# Patient Record
Sex: Male | Born: 1945
Health system: Southern US, Community
[De-identification: ages and names within clinical notes are randomized; demographics above are authoritative.]

## PROBLEM LIST (undated history)

## (undated) DIAGNOSIS — I10 Essential (primary) hypertension: Secondary | ICD-10-CM

## (undated) DIAGNOSIS — E7800 Pure hypercholesterolemia, unspecified: Secondary | ICD-10-CM

## (undated) DIAGNOSIS — C61 Malignant neoplasm of prostate: Secondary | ICD-10-CM

## (undated) DIAGNOSIS — H9190 Unspecified hearing loss, unspecified ear: Secondary | ICD-10-CM

## (undated) HISTORY — PX: OTHER SURGICAL HISTORY: SHX169

---

## 2014-09-10 DIAGNOSIS — R972 Elevated prostate specific antigen [PSA]: Secondary | ICD-10-CM | POA: Insufficient documentation

## 2014-10-30 DIAGNOSIS — C61 Malignant neoplasm of prostate: Secondary | ICD-10-CM | POA: Insufficient documentation

## 2014-12-16 ENCOUNTER — Emergency Department (HOSPITAL_COMMUNITY)
Admission: EM | Admit: 2014-12-16 | Discharge: 2014-12-16 | Disposition: A | Discharge status: Home / Self Care | Payer: Medicare Other | Attending: Emergency Medicine | Admitting: Emergency Medicine

## 2014-12-16 ENCOUNTER — Encounter (HOSPITAL_COMMUNITY): Payer: Self-pay | Admitting: Emergency Medicine

## 2014-12-16 ENCOUNTER — Emergency Department (HOSPITAL_COMMUNITY): Payer: Medicare Other

## 2014-12-16 DIAGNOSIS — S60812A Abrasion of left wrist, initial encounter: Secondary | ICD-10-CM | POA: Diagnosis not present

## 2014-12-16 DIAGNOSIS — Y9289 Other specified places as the place of occurrence of the external cause: Secondary | ICD-10-CM | POA: Diagnosis not present

## 2014-12-16 DIAGNOSIS — W2209XA Striking against other stationary object, initial encounter: Secondary | ICD-10-CM | POA: Diagnosis not present

## 2014-12-16 DIAGNOSIS — C61 Malignant neoplasm of prostate: Secondary | ICD-10-CM | POA: Diagnosis not present

## 2014-12-16 DIAGNOSIS — T1490XA Injury, unspecified, initial encounter: Secondary | ICD-10-CM

## 2014-12-16 DIAGNOSIS — Y998 Other external cause status: Secondary | ICD-10-CM | POA: Diagnosis not present

## 2014-12-16 DIAGNOSIS — I10 Essential (primary) hypertension: Secondary | ICD-10-CM | POA: Diagnosis not present

## 2014-12-16 DIAGNOSIS — Z8639 Personal history of other endocrine, nutritional and metabolic disease: Secondary | ICD-10-CM | POA: Diagnosis not present

## 2014-12-16 DIAGNOSIS — Y9389 Activity, other specified: Secondary | ICD-10-CM | POA: Diagnosis not present

## 2014-12-16 DIAGNOSIS — R52 Pain, unspecified: Secondary | ICD-10-CM

## 2014-12-16 DIAGNOSIS — S6992XA Unspecified injury of left wrist, hand and finger(s), initial encounter: Secondary | ICD-10-CM

## 2014-12-16 HISTORY — DX: Pure hypercholesterolemia, unspecified: E78.00

## 2014-12-16 HISTORY — DX: Essential (primary) hypertension: I10

## 2014-12-16 HISTORY — DX: Malignant neoplasm of prostate: C61

## 2014-12-16 MED ORDER — HYDROCODONE-ACETAMINOPHEN 5-325 MG PO TABS: 1.0000 | ORAL_TABLET | ORAL | Status: DC | PRN

## 2014-12-16 NOTE — Discharge Instructions (Signed)
Take the prescribed medication as directed.  Wear ace wrap as placed for comfort.  May also wish to ice and elevate wrist to help with pain/swelling. Follow-up with your primary care physician. Return to the ED for new or worsening symptoms.
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
## 2015-05-11 DIAGNOSIS — N401 Enlarged prostate with lower urinary tract symptoms: Secondary | ICD-10-CM | POA: Insufficient documentation

## 2016-01-14 DIAGNOSIS — I10 Essential (primary) hypertension: Secondary | ICD-10-CM | POA: Insufficient documentation

## 2020-11-11 DIAGNOSIS — C61 Malignant neoplasm of prostate: Secondary | ICD-10-CM | POA: Diagnosis not present

## 2020-11-11 DIAGNOSIS — M129 Arthropathy, unspecified: Secondary | ICD-10-CM | POA: Diagnosis not present

## 2020-11-11 DIAGNOSIS — R310 Gross hematuria: Secondary | ICD-10-CM | POA: Insufficient documentation

## 2020-11-11 DIAGNOSIS — E559 Vitamin D deficiency, unspecified: Secondary | ICD-10-CM | POA: Diagnosis not present

## 2020-11-11 DIAGNOSIS — Z1159 Encounter for screening for other viral diseases: Secondary | ICD-10-CM | POA: Diagnosis not present

## 2020-11-11 DIAGNOSIS — Z23 Encounter for immunization: Secondary | ICD-10-CM | POA: Diagnosis not present

## 2020-11-11 DIAGNOSIS — R5383 Other fatigue: Secondary | ICD-10-CM | POA: Diagnosis not present

## 2020-11-11 DIAGNOSIS — Z131 Encounter for screening for diabetes mellitus: Secondary | ICD-10-CM | POA: Diagnosis not present

## 2020-11-11 DIAGNOSIS — E7800 Pure hypercholesterolemia, unspecified: Secondary | ICD-10-CM | POA: Diagnosis not present

## 2020-11-11 DIAGNOSIS — Z79899 Other long term (current) drug therapy: Secondary | ICD-10-CM | POA: Diagnosis not present

## 2020-11-11 DIAGNOSIS — Z0000 Encounter for general adult medical examination without abnormal findings: Secondary | ICD-10-CM | POA: Diagnosis not present

## 2020-12-08 DIAGNOSIS — R310 Gross hematuria: Secondary | ICD-10-CM | POA: Diagnosis not present

## 2020-12-08 DIAGNOSIS — R399 Unspecified symptoms and signs involving the genitourinary system: Secondary | ICD-10-CM | POA: Diagnosis not present

## 2020-12-08 DIAGNOSIS — R972 Elevated prostate specific antigen [PSA]: Secondary | ICD-10-CM | POA: Diagnosis not present

## 2020-12-08 DIAGNOSIS — K449 Diaphragmatic hernia without obstruction or gangrene: Secondary | ICD-10-CM | POA: Diagnosis not present

## 2020-12-08 DIAGNOSIS — C61 Malignant neoplasm of prostate: Secondary | ICD-10-CM | POA: Diagnosis not present

## 2020-12-09 DIAGNOSIS — Z79899 Other long term (current) drug therapy: Secondary | ICD-10-CM | POA: Diagnosis not present

## 2020-12-09 DIAGNOSIS — G8929 Other chronic pain: Secondary | ICD-10-CM | POA: Diagnosis not present

## 2020-12-09 DIAGNOSIS — Z1331 Encounter for screening for depression: Secondary | ICD-10-CM | POA: Diagnosis not present

## 2020-12-09 DIAGNOSIS — M5442 Lumbago with sciatica, left side: Secondary | ICD-10-CM | POA: Diagnosis not present

## 2020-12-09 DIAGNOSIS — Z1339 Encounter for screening examination for other mental health and behavioral disorders: Secondary | ICD-10-CM | POA: Diagnosis not present

## 2021-01-08 DIAGNOSIS — G8929 Other chronic pain: Secondary | ICD-10-CM | POA: Diagnosis not present

## 2021-01-08 DIAGNOSIS — I10 Essential (primary) hypertension: Secondary | ICD-10-CM | POA: Diagnosis not present

## 2021-01-08 DIAGNOSIS — R7303 Prediabetes: Secondary | ICD-10-CM | POA: Diagnosis not present

## 2021-01-08 DIAGNOSIS — M5442 Lumbago with sciatica, left side: Secondary | ICD-10-CM | POA: Diagnosis not present

## 2021-02-01 ENCOUNTER — Emergency Department (HOSPITAL_COMMUNITY)
Admission: EM | Admit: 2021-02-01 | Discharge: 2021-02-01 | Disposition: A | Discharge status: Home / Self Care | Payer: Medicare HMO | Attending: Emergency Medicine | Admitting: Emergency Medicine

## 2021-02-01 ENCOUNTER — Other Ambulatory Visit: Payer: Self-pay

## 2021-02-01 ENCOUNTER — Emergency Department (HOSPITAL_COMMUNITY): Payer: Medicare HMO

## 2021-02-01 ENCOUNTER — Encounter (HOSPITAL_COMMUNITY): Payer: Self-pay

## 2021-02-01 DIAGNOSIS — M25562 Pain in left knee: Secondary | ICD-10-CM | POA: Diagnosis not present

## 2021-02-01 DIAGNOSIS — Z8546 Personal history of malignant neoplasm of prostate: Secondary | ICD-10-CM | POA: Insufficient documentation

## 2021-02-01 DIAGNOSIS — W19XXXA Unspecified fall, initial encounter: Secondary | ICD-10-CM | POA: Diagnosis not present

## 2021-02-01 DIAGNOSIS — I10 Essential (primary) hypertension: Secondary | ICD-10-CM | POA: Diagnosis not present

## 2021-02-01 DIAGNOSIS — M1712 Unilateral primary osteoarthritis, left knee: Secondary | ICD-10-CM | POA: Diagnosis not present

## 2021-02-01 MED ORDER — LIDOCAINE 5 % EX PTCH: 1.0000 | MEDICATED_PATCH | CUTANEOUS | 0 refills | Status: DC

## 2021-02-01 MED ORDER — DICLOFENAC SODIUM 1 % EX GEL: 4.0000 g | Freq: Four times a day (QID) | CUTANEOUS | 0 refills | Status: DC

## 2021-02-01 NOTE — ED Provider Notes (Signed)
[facility] EMERGENCY DEPARTMENT Provider Note   CSN: [id number] Arrival date & time: 02/01/21  1543    History Chief Complaint  Patient presents with  . Leg Pain    Mason Aguilar is a 75 y.o. male with past medical history significant for hypertension, hypercholesterolemia, significantly hard of hearing who presents for evaluation of left knee pain.  Patient states 3 days ago he had a mechanical fall.  Patient states he has chronic bilateral knee pain, worse when he goes up stairs, going from sitting to standing.  Patient states he was walking and his left knee gave out on him.  He subsequently fell.  He denies hitting his head, LOC or anticoagulation.  Patient states his left knee has been hurting since he fell even worse than his baseline.  He denies any redness, swelling or warmth.  Pain does not extend into his distal leg, no hip pain, femur pain.  He was able to get up and walk.  He is not followed by orthopedics.  Does feel like he limps when he walks at baseline.  He takes oxycodone for chronic pain.  He denies any preceding headache, lightheadedness, dizziness, chest pain, shortness of breath, syncope, weakness, paresthesias.  Denies additional aggravating or alleviating factors.  History obtained from patient, family in room and past medical records.  Interpreter used.  HPI     Past Medical History:  Diagnosis Date  . Hypercholesteremia   . Hypertension   . Prostate cancer ([place])     There are no problems to display for this patient.   Past Surgical History:  Procedure Laterality Date  . arm surgery    . polyps         History reviewed. No pertinent family history.  Social History   Tobacco Use  . Smoking status: Never Smoker  . Smokeless tobacco: Never Used  Substance Use Topics  . Alcohol use: No  . Drug use: No    Home Medications Prior to Admission medications   Medication Sig Start Date End Date Taking? Authorizing Provider  diclofenac Sodium  (VOLTAREN) 1 % GEL Apply 4 g topically 4 (four) times daily. 02/01/21  Yes Gyselle Matthew A, PA-C  lidocaine (LIDODERM) 5 % Place 1 patch onto the skin daily. Remove & Discard patch within 12 hours or as directed by MD 02/01/21  Yes Anya Murphey A, PA-C  HYDROcodone-acetaminophen (NORCO/VICODIN) 5-325 MG per tablet Take 1 tablet by mouth every 4 (four) hours as needed. 12/16/14   Larene Pickett, PA-C    Allergies    Patient has no known allergies.  Review of Systems   Review of Systems  Constitutional: Negative.   HENT: Negative.   Respiratory: Negative.   Cardiovascular: Negative.   Gastrointestinal: Negative.   Genitourinary: Negative.   Musculoskeletal: Positive for gait problem (Limp).       Left knee pain  Skin: Negative.   All other systems reviewed and are negative.   Physical Exam Updated Vital Signs BP (!) 143/69 (BP Location: Right Arm)   Pulse 81   Temp 98.3 F (36.8 C) (Oral)   Resp 18   Ht 5\' 6"  (1.676 m)   Wt 106.6 kg   SpO2 96%   BMI 37.93 kg/m   Physical Exam Vitals and nursing note reviewed.  Constitutional:      General: He is not in acute distress.    Appearance: He is well-developed and well-nourished. He is not ill-appearing, toxic-appearing or diaphoretic.  HENT:  Head: Normocephalic and atraumatic.     Nose: Nose normal.     Mouth/Throat:     Mouth: Mucous membranes are moist.  Eyes:     Pupils: Pupils are equal, round, and reactive to light.  Cardiovascular:     Rate and Rhythm: Normal rate and regular rhythm.     Pulses: Normal pulses.     Heart sounds: Normal heart sounds.  Pulmonary:     Effort: Pulmonary effort is normal. No respiratory distress.     Breath sounds: Normal breath sounds.  Abdominal:     General: Bowel sounds are normal. There is no distension.     Palpations: Abdomen is soft.     Tenderness: There is no abdominal tenderness. There is no right CVA tenderness, left CVA tenderness, guarding or rebound.   Musculoskeletal:        General: Normal range of motion.     Cervical back: Normal range of motion and neck supple.     Right lower leg: No edema.     Left lower leg: No edema.     Comments: Pelvis stable, nontender palpation.  No bony tenderness to bilateral femurs, tib-fib.  Able to flex and extend at bilateral knees, ankles.  Wiggles toes without difficulty.  No shortening or rotation of legs.  Negative varus and valgus stress.  Negative anterior drawer.  Diffuse tenderness to left anterior knee however no obvious effusion.  No overlying skin changes, abrasions.  Skin:    General: Skin is warm and dry.     Capillary Refill: Capillary refill takes less than 2 seconds.     Comments: No edema, erythema or warmth.  No fluctuance or induration.  Neurological:     General: No focal deficit present.     Mental Status: He is alert and oriented to person, place, and time.     Comments: CN 2-12 grossly intact Intact sensation 5/5 strength to BL upper and lower extremities  Psychiatric:        Mood and Affect: Mood and affect normal.     ED Results / Procedures / Treatments   Labs (all labs ordered are listed, but only abnormal results are displayed) Labs Reviewed - No data to display  EKG None  Radiology DG Knee Left Port  Result Date: 02/01/2021 CLINICAL DATA:  Fall 2 days ago with left knee pain, initial encounter EXAM: PORTABLE LEFT KNEE - 1-2 VIEW COMPARISON:  None. FINDINGS: Mild patellofemoral degenerative changes are noted. No acute fracture or dislocation is seen. No joint effusion is noted. IMPRESSION: Mild degenerative change without acute abnormality. Electronically Signed   By: Inez Catalina M.D.   On: 02/01/2021 16:38    Procedures Procedures   Medications Ordered in ED Medications - No data to display  ED Course  I have reviewed the triage vital signs and the nursing notes.  Pertinent labs & imaging results that were available during my care of the patient were  reviewed by me and considered in my medical decision making (see chart for details).  75 year old presents for evaluation mechanical fall which occurred 3 days ago.  Has baseline chronic knee pain for which she takes oxycodone.  He is afebrile, nonseptic, non-ill-appearing.  Mechanical fall which he landed on the lateral aspect of his left knee.  His pain worsening when he goes from sitting to standing and up stairs.  He has a nonfocal neuro exam without deficits.  No preceding symptoms prior to fall.  no paresthesias or weakness.  His pelvis is stable, nontender palpation.  I have low suspicion for radicular pain.  No tenderness of bilateral femurs, tib-fib.  negative varus, valgus stress.  Negative anterior drawer. Pelvis stable non tender, no shortening or rotation of leg. Midline spine WO tenderness. His compartments are soft  Likely acute on chronic pain.  He has no overlying skin changes to suggest infectious process, specifically no large effusion, erythema, warmth.  He is able to flex and extend without difficulty.  Able to straight leg raise.  Discussed anti-inflammatories, knee brace, follow-up with orthopedics.  X-rays obtained here which I personally reviewed show no evidence of fracture, dislocation.  Low suspicion for occult fracture.  Also low suspicion for septic joint, gout, hemarthrosis, myositis, VTE, rhabdomyolysis.  Patient and family agreeable to follow-up outpatient.  The patient has been appropriately medically screened and/or stabilized in the ED. I have low suspicion for any other emergent medical condition which would require further screening, evaluation or treatment in the ED or require inpatient management.  Patient is hemodynamically stable and in no acute distress.  Patient able to ambulate in department prior to ED.  Evaluation does not show acute pathology that would require ongoing or additional emergent interventions while in the emergency department or further inpatient  treatment.  I have discussed the diagnosis with the patient and answered all questions.  Pain is been managed while in the emergency department and patient has no further complaints prior to discharge.  Patient is comfortable with plan discussed in room and is stable for discharge at this time.  I have discussed strict return precautions for returning to the emergency department.  Patient was encouraged to follow-up with PCP/specialist refer to at discharge.  Patient seen by attending, Dr. Kathrynn Humble who agrees with above treatment, plan and disposition.    MDM Rules/Calculators/A&P                           Final Clinical Impression(s) / ED Diagnoses Final diagnoses:  Fall  Acute pain of left knee    Rx / DC Orders ED Discharge Orders         Ordered    lidocaine (LIDODERM) 5 %  Every 24 hours        02/01/21 1902    diclofenac Sodium (VOLTAREN) 1 % GEL  4 times daily        02/01/21 1902           Caeson Filippi A, PA-C 02/01/21 Royann Shivers, MD 02/01/21 1931

## 2021-02-01 NOTE — Discharge Instructions (Addendum)
May Take Ibuprofen as needed for pain  I have written for Lidoderm patches and Voltaren gel. Use as prescribed  Follow up with orthopedics  Return for new or worsening symptoms

## 2021-02-01 NOTE — ED Triage Notes (Signed)
Pt to er, niece with pt helping to communicate with him, states that pt is very hard of hearing and reads lips.  States that pt fell on Saturday and hurt his leg.  Pt states that he staggered and fell. States that he hurt his L leg and now he is limping when he walks.

## 2021-02-06 DIAGNOSIS — I10 Essential (primary) hypertension: Secondary | ICD-10-CM | POA: Diagnosis not present

## 2021-02-06 DIAGNOSIS — R7303 Prediabetes: Secondary | ICD-10-CM | POA: Diagnosis not present

## 2021-02-06 DIAGNOSIS — G8929 Other chronic pain: Secondary | ICD-10-CM | POA: Diagnosis not present

## 2021-02-06 DIAGNOSIS — M5442 Lumbago with sciatica, left side: Secondary | ICD-10-CM | POA: Diagnosis not present

## 2021-03-05 DIAGNOSIS — G8929 Other chronic pain: Secondary | ICD-10-CM | POA: Diagnosis not present

## 2021-03-05 DIAGNOSIS — E7800 Pure hypercholesterolemia, unspecified: Secondary | ICD-10-CM | POA: Diagnosis not present

## 2021-03-05 DIAGNOSIS — M5442 Lumbago with sciatica, left side: Secondary | ICD-10-CM | POA: Diagnosis not present

## 2021-03-05 DIAGNOSIS — R7303 Prediabetes: Secondary | ICD-10-CM | POA: Diagnosis not present

## 2021-03-05 DIAGNOSIS — Z79899 Other long term (current) drug therapy: Secondary | ICD-10-CM | POA: Diagnosis not present

## 2021-03-05 DIAGNOSIS — I10 Essential (primary) hypertension: Secondary | ICD-10-CM | POA: Diagnosis not present

## 2021-04-02 DIAGNOSIS — G8929 Other chronic pain: Secondary | ICD-10-CM | POA: Diagnosis not present

## 2021-04-02 DIAGNOSIS — M79605 Pain in left leg: Secondary | ICD-10-CM | POA: Diagnosis not present

## 2021-04-02 DIAGNOSIS — M5442 Lumbago with sciatica, left side: Secondary | ICD-10-CM | POA: Diagnosis not present

## 2021-04-02 DIAGNOSIS — M79604 Pain in right leg: Secondary | ICD-10-CM | POA: Diagnosis not present

## 2021-04-02 DIAGNOSIS — I10 Essential (primary) hypertension: Secondary | ICD-10-CM | POA: Diagnosis not present

## 2021-04-02 DIAGNOSIS — R0989 Other specified symptoms and signs involving the circulatory and respiratory systems: Secondary | ICD-10-CM | POA: Diagnosis not present

## 2021-04-08 ENCOUNTER — Other Ambulatory Visit: Payer: Self-pay

## 2021-04-08 ENCOUNTER — Ambulatory Visit: Payer: Medicare HMO | Admitting: Urology

## 2021-04-08 ENCOUNTER — Encounter: Payer: Self-pay | Admitting: Urology

## 2021-04-08 VITALS — BP 123/70 | HR 92 | Ht 67.0 in | Wt 235.0 lb

## 2021-04-08 DIAGNOSIS — R310 Gross hematuria: Secondary | ICD-10-CM

## 2021-04-08 DIAGNOSIS — R319 Hematuria, unspecified: Secondary | ICD-10-CM | POA: Diagnosis not present

## 2021-04-08 DIAGNOSIS — C61 Malignant neoplasm of prostate: Secondary | ICD-10-CM

## 2021-04-08 LAB — BLADDER SCAN AMB NON-IMAGING: Scan Result: 0

## 2021-04-08 NOTE — Progress Notes (Signed)
04/08/21 1:31 PM   Mason Aguilar [birth year] [id number]  CC: Gross hematuria, history of prostate cancer, possible renal mass  HPI: I saw Mason Aguilar and his sister, who is his guardian, in clinic today for the above issues.  He is a 75 year old male with developmental delay who previously was seen by urology at [facility].  His history is notable for unfavorable intermediate risk prostate cancer diagnosed in October 2015 with pretreatment PSA of 5.8 and reported SV invasion on MRI with negative bone scan and prostate volume of 60 g.  He was treated with ADT and external beam radiation.  Unclear how long he was on ADT for.  PSA initially decreased to 0.01, but has slowly risen since that time, most recently 1.01 in November 2021.  Most recently, he developed painless gross hematuria in December 2021 and underwent a noncontrast CT abdomen and pelvis that showed no urolithiasis or hydronephrosis, and a reported soft tissue prominence along the lower pole of the left kidney that measured 3 cm.  Exam limited by lack of contrast.  I am unable to personally review these films.  He also underwent a cystoscopy which reportedly showed changes consistent with radiation cystitis at the bladder neck.  He denies any pain at this time.  He takes Flomax.  He had another episode a week ago of painless gross hematuria with clots.  IPSS score today is 5 with quality of life mostly satisfied.  PVR normal today at 0 mL.  Urinalysis today benign with 0-5 WBCs, 0-2 RBCs, no bacteria, no yeast, nitrite negative, no leukocytes.   They live in [place] and would prefer follow-up there.  PMH: Past Medical History:  Diagnosis Date  . Hypercholesteremia   . Hypertension   . Prostate cancer [facility])     Surgical History: Past Surgical History:  Procedure Laterality Date  . arm surgery    . polyps     Social History:  reports that he has never smoked. He has never used smokeless tobacco. He reports that  he does not drink alcohol and does not use drugs.  Physical Exam: BP 123/70   Pulse 92   Ht 5\' 7"  (1.702 m)   Wt 235 lb (106.6 kg)   BMI 36.81 kg/m    Constitutional:  Alert and oriented, No acute distress. Cardiovascular: No clubbing, cyanosis, or edema. Respiratory: Normal respiratory effort, no increased work of breathing. GI: Abdomen is soft, nontender, nondistended, no abdominal masses  Laboratory Data: Reviewed, see HPI  Pertinent Imaging: See HPI  Assessment & Plan:   75 year old male with history of unfavorable intermediate risk prostate cancer in 2015 treated with ADT of unclear duration and external beam radiation with initial significant decrease in PSA to 0, now with slow rise to 1.01, as well as multiple episodes of painless gross hematuria over the last few months.  He has undergone partial work-up in December 2021 by a urologist at [facility] with a non-contrast CT that showed a possible left renal mass and cystoscopy that was consistent with radiation cystitis.  I recommended a PSA today, as well as a CT urogram for better evaluation of the previously mentioned possible renal mass, as well as to complete his gross hematuria work-up.  We discussed possible need for cystoscopy and bladder biopsy in the future to confirm the diagnosis of radiation cystitis.  We also discussed treatment options including hyperbaric oxygen therapy if he is confirmed to have radiation cystitis.  PSA today, call with results  CT urogram ordered, call with results Will facilitate follow-up in [place] locally  Nickolas Madrid, MD 04/08/2021  [facility] [address], [place], [place] [address] [phone]

## 2021-04-09 LAB — PSA: Prostate Specific Ag, Serum: 1.6 ng/mL (ref 0.0–4.0)

## 2021-04-12 LAB — MICROSCOPIC EXAMINATION
Bacteria, UA: NONE SEEN
Epithelial Cells (non renal): NONE SEEN /hpf (ref 0–10)

## 2021-04-12 LAB — URINALYSIS, COMPLETE
Bilirubin, UA: NEGATIVE
Glucose, UA: NEGATIVE
Ketones, UA: NEGATIVE
Leukocytes,UA: NEGATIVE
Nitrite, UA: NEGATIVE
Protein,UA: NEGATIVE
RBC, UA: NEGATIVE
Specific Gravity, UA: 1.02 (ref 1.005–1.030)
Urobilinogen, Ur: 2 mg/dL — ABNORMAL HIGH (ref 0.2–1.0)
pH, UA: 7 (ref 5.0–7.5)

## 2021-05-06 ENCOUNTER — Telehealth: Payer: Self-pay

## 2021-05-06 NOTE — Telephone Encounter (Signed)
-----   Message from Billey Co, MD sent at 05/06/2021  9:30 AM EDT ----- PSA has increased slightly to 1.6. Please confirm he has follow up locally in [place], thanks.   Nickolas Madrid, MD 05/06/2021

## 2021-05-06 NOTE — Telephone Encounter (Signed)
Called pt, no answer. LM for pt informing him of the information below. Advised pt to call back for questions or concerns.

## 2021-05-06 NOTE — Telephone Encounter (Signed)
Notified patient as instructed,  Still waiting on [place] to call

## 2021-05-07 NOTE — Telephone Encounter (Signed)
Patient has been scheduled a follow up appointment with Dr. Junious Silk in [place] for 05/31/2021 at 3:00pm.  Patient has been notified.

## 2021-05-09 ENCOUNTER — Emergency Department (HOSPITAL_COMMUNITY): Payer: Medicare HMO

## 2021-05-09 ENCOUNTER — Encounter (HOSPITAL_COMMUNITY): Payer: Self-pay | Admitting: Emergency Medicine

## 2021-05-09 ENCOUNTER — Other Ambulatory Visit: Payer: Self-pay

## 2021-05-09 ENCOUNTER — Emergency Department (HOSPITAL_COMMUNITY)
Admission: EM | Admit: 2021-05-09 | Discharge: 2021-05-10 | Disposition: A | Discharge status: Home / Self Care | Payer: Medicare HMO | Attending: Emergency Medicine | Admitting: Emergency Medicine

## 2021-05-09 DIAGNOSIS — Z79899 Other long term (current) drug therapy: Secondary | ICD-10-CM | POA: Insufficient documentation

## 2021-05-09 DIAGNOSIS — S1081XA Abrasion of other specified part of neck, initial encounter: Secondary | ICD-10-CM | POA: Diagnosis not present

## 2021-05-09 DIAGNOSIS — Y92410 Unspecified street and highway as the place of occurrence of the external cause: Secondary | ICD-10-CM | POA: Diagnosis not present

## 2021-05-09 DIAGNOSIS — I10 Essential (primary) hypertension: Secondary | ICD-10-CM | POA: Diagnosis not present

## 2021-05-09 DIAGNOSIS — S301XXA Contusion of abdominal wall, initial encounter: Secondary | ICD-10-CM | POA: Diagnosis not present

## 2021-05-09 DIAGNOSIS — Z8546 Personal history of malignant neoplasm of prostate: Secondary | ICD-10-CM | POA: Diagnosis not present

## 2021-05-09 DIAGNOSIS — S20212A Contusion of left front wall of thorax, initial encounter: Secondary | ICD-10-CM | POA: Diagnosis not present

## 2021-05-09 DIAGNOSIS — N289 Disorder of kidney and ureter, unspecified: Secondary | ICD-10-CM

## 2021-05-09 DIAGNOSIS — S6991XA Unspecified injury of right wrist, hand and finger(s), initial encounter: Secondary | ICD-10-CM | POA: Diagnosis present

## 2021-05-09 DIAGNOSIS — S62306A Unspecified fracture of fifth metacarpal bone, right hand, initial encounter for closed fracture: Secondary | ICD-10-CM

## 2021-05-09 DIAGNOSIS — Z7982 Long term (current) use of aspirin: Secondary | ICD-10-CM | POA: Diagnosis not present

## 2021-05-09 DIAGNOSIS — S62398A Other fracture of other metacarpal bone, initial encounter for closed fracture: Secondary | ICD-10-CM | POA: Diagnosis not present

## 2021-05-09 DIAGNOSIS — R918 Other nonspecific abnormal finding of lung field: Secondary | ICD-10-CM

## 2021-05-09 LAB — CBC
HCT: 45.3 % (ref 39.0–52.0)
Hemoglobin: 14.4 g/dL (ref 13.0–17.0)
MCH: 27.3 pg (ref 26.0–34.0)
MCHC: 31.8 g/dL (ref 30.0–36.0)
MCV: 86 fL (ref 80.0–100.0)
Platelets: 204 10*3/uL (ref 150–400)
RBC: 5.27 MIL/uL (ref 4.22–5.81)
RDW: 13.5 % (ref 11.5–15.5)
WBC: 14.9 10*3/uL — ABNORMAL HIGH (ref 4.0–10.5)
nRBC: 0 % (ref 0.0–0.2)

## 2021-05-09 LAB — BASIC METABOLIC PANEL
Anion gap: 7 (ref 5–15)
BUN: 14 mg/dL (ref 8–23)
CO2: 23 mmol/L (ref 22–32)
Calcium: 8.5 mg/dL — ABNORMAL LOW (ref 8.9–10.3)
Chloride: 106 mmol/L (ref 98–111)
Creatinine, Ser: 0.96 mg/dL (ref 0.61–1.24)
GFR, Estimated: >60 mL/min (ref >60)
Glucose, Bld: 98 mg/dL (ref 70–99)
Potassium: 3.6 mmol/L (ref 3.5–5.1)
Sodium: 136 mmol/L (ref 135–145)
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
## 2021-05-13 ENCOUNTER — Telehealth: Payer: Self-pay | Admitting: Orthopedic Surgery

## 2021-05-13 NOTE — Telephone Encounter (Signed)
May 26
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
## 2021-05-20 ENCOUNTER — Ambulatory Visit: Payer: Medicare HMO | Admitting: Orthopedic Surgery

## 2021-05-27 ENCOUNTER — Ambulatory Visit: Payer: Medicare HMO | Admitting: Orthopedic Surgery

## 2021-05-27 ENCOUNTER — Other Ambulatory Visit: Payer: Self-pay

## 2021-05-27 ENCOUNTER — Encounter: Payer: Self-pay | Admitting: Orthopedic Surgery

## 2021-05-27 ENCOUNTER — Ambulatory Visit: Payer: Medicare HMO

## 2021-05-27 VITALS — BP 139/79 | HR 83 | Ht 67.0 in | Wt 235.0 lb

## 2021-05-27 DIAGNOSIS — S62336A Displaced fracture of neck of fifth metacarpal bone, right hand, initial encounter for closed fracture: Secondary | ICD-10-CM | POA: Diagnosis not present

## 2021-05-27 NOTE — Addendum Note (Signed)
Addended byCandice Camp on: 05/27/2021 04:13 PM   Modules accepted: Orders
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
## 2021-05-27 NOTE — Patient Instructions (Signed)
[facility] will call you with appointment. If you have not heard from them in 2-3 business days call them to schedule [phone]   

## 2021-05-31 ENCOUNTER — Ambulatory Visit (INDEPENDENT_AMBULATORY_CARE_PROVIDER_SITE_OTHER): Payer: Medicare HMO | Admitting: Urology

## 2021-05-31 ENCOUNTER — Encounter: Payer: Self-pay | Admitting: Urology

## 2021-05-31 ENCOUNTER — Other Ambulatory Visit: Payer: Self-pay

## 2021-05-31 VITALS — BP 140/92 | HR 84 | Ht 67.0 in | Wt 232.6 lb

## 2021-05-31 DIAGNOSIS — R9721 Rising PSA following treatment for malignant neoplasm of prostate: Secondary | ICD-10-CM

## 2021-05-31 DIAGNOSIS — C61 Malignant neoplasm of prostate: Secondary | ICD-10-CM

## 2021-05-31 DIAGNOSIS — N281 Cyst of kidney, acquired: Secondary | ICD-10-CM

## 2021-05-31 DIAGNOSIS — R319 Hematuria, unspecified: Secondary | ICD-10-CM

## 2021-05-31 DIAGNOSIS — N2889 Other specified disorders of kidney and ureter: Secondary | ICD-10-CM

## 2021-05-31 LAB — URINALYSIS, ROUTINE W REFLEX MICROSCOPIC
Bilirubin, UA: NEGATIVE
Glucose, UA: NEGATIVE
Ketones, UA: NEGATIVE
Leukocytes,UA: NEGATIVE
Nitrite, UA: NEGATIVE
Protein,UA: NEGATIVE
RBC, UA: NEGATIVE
Specific Gravity, UA: 1.02 (ref 1.005–1.030)
Urobilinogen, Ur: 0.2 mg/dL (ref 0.2–1.0)
pH, UA: 7 (ref 5.0–7.5)
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
## 2021-05-31 NOTE — Progress Notes (Signed)
Urological Symptom Review  Patient is experiencing the following symptoms: Blood in urine   Review of Systems  Gastrointestinal (upper)  : Negative for upper GI symptoms  Gastrointestinal (lower) : Negative for lower GI symptoms  Constitutional : Negative for symptoms  Skin: Negative for skin symptoms  Eyes: Negative for eye symptoms  Ear/Nose/Throat : Negative for Ear/Nose/Throat symptoms  Hematologic/Lymphatic: Easy bruising  Cardiovascular : None  Respiratory : Negative for respiratory symptoms  Endocrine: Negative for endocrine symptoms  Musculoskeletal: Joint pain  Neurological: Dizziness  Psychologic: Negative for psychiatric symptoms

## 2021-06-02 ENCOUNTER — Encounter (HOSPITAL_BASED_OUTPATIENT_CLINIC_OR_DEPARTMENT_OTHER): Payer: Self-pay | Admitting: Orthopedic Surgery

## 2021-06-02 ENCOUNTER — Other Ambulatory Visit: Payer: Self-pay | Admitting: Orthopedic Surgery

## 2021-06-02 ENCOUNTER — Other Ambulatory Visit: Payer: Self-pay

## 2021-06-02 DIAGNOSIS — S62336A Displaced fracture of neck of fifth metacarpal bone, right hand, initial encounter for closed fracture: Secondary | ICD-10-CM | POA: Insufficient documentation

## 2021-06-03 ENCOUNTER — Ambulatory Visit (HOSPITAL_BASED_OUTPATIENT_CLINIC_OR_DEPARTMENT_OTHER)
Admission: RE | Admit: 2021-06-03 | Discharge: 2021-06-03 | Disposition: A | Discharge status: Home / Self Care | Payer: Medicare HMO | Attending: Orthopedic Surgery | Admitting: Orthopedic Surgery

## 2021-06-03 ENCOUNTER — Ambulatory Visit (HOSPITAL_BASED_OUTPATIENT_CLINIC_OR_DEPARTMENT_OTHER): Payer: Medicare HMO | Admitting: Anesthesiology

## 2021-06-03 ENCOUNTER — Encounter (HOSPITAL_BASED_OUTPATIENT_CLINIC_OR_DEPARTMENT_OTHER)
Admission: RE | Disposition: A | Discharge status: Home / Self Care | Payer: Self-pay | Source: Home / Self Care | Attending: Orthopedic Surgery

## 2021-06-03 ENCOUNTER — Other Ambulatory Visit: Payer: Self-pay

## 2021-06-03 ENCOUNTER — Encounter (HOSPITAL_BASED_OUTPATIENT_CLINIC_OR_DEPARTMENT_OTHER): Payer: Self-pay | Admitting: Orthopedic Surgery

## 2021-06-03 DIAGNOSIS — S62306A Unspecified fracture of fifth metacarpal bone, right hand, initial encounter for closed fracture: Secondary | ICD-10-CM | POA: Insufficient documentation

## 2021-06-03 DIAGNOSIS — Z8546 Personal history of malignant neoplasm of prostate: Secondary | ICD-10-CM | POA: Insufficient documentation

## 2021-06-03 DIAGNOSIS — E119 Type 2 diabetes mellitus without complications: Secondary | ICD-10-CM | POA: Diagnosis not present

## 2021-06-03 HISTORY — PX: OPEN REDUCTION INTERNAL FIXATION (ORIF) METACARPAL: SHX6234

## 2021-06-03 HISTORY — DX: Unspecified hearing loss, unspecified ear: H91.90

## 2021-06-03 SURGERY — OPEN REDUCTION INTERNAL FIXATION (ORIF) METACARPAL
Anesthesia: Monitor Anesthesia Care | Site: Hand | Laterality: Right

## 2021-06-03 MED ORDER — CLONIDINE HCL (ANALGESIA) 100 MCG/ML EP SOLN
EPIDURAL | Status: DC | PRN
  Administered 2021-06-03: 100 ug

## 2021-06-03 MED ORDER — OXYCODONE-ACETAMINOPHEN 5-325 MG PO TABS: 1.0000 | ORAL_TABLET | ORAL | 0 refills | Status: AC | PRN

## 2021-06-03 MED ORDER — BUPIVACAINE-EPINEPHRINE (PF) 0.5% -1:200000 IJ SOLN
INTRAMUSCULAR | Status: DC | PRN
  Administered 2021-06-03: 30 mL via PERINEURAL

## 2021-06-03 MED ORDER — PROPOFOL 500 MG/50ML IV EMUL
INTRAVENOUS | Status: DC | PRN
  Administered 2021-06-03: 75 ug/kg/min via INTRAVENOUS

## 2021-06-03 MED ORDER — LACTATED RINGERS IV SOLN: INTRAVENOUS | Status: DC

## 2021-06-03 MED ORDER — PROMETHAZINE HCL 25 MG/ML IJ SOLN: 6.2500 mg | INTRAMUSCULAR | Status: DC | PRN

## 2021-06-03 MED ORDER — PROPOFOL 10 MG/ML IV BOLUS
INTRAVENOUS | Status: DC | PRN
  Administered 2021-06-03 (×2): 20 mg via INTRAVENOUS

## 2021-06-03 MED ORDER — PROPOFOL 10 MG/ML IV BOLUS
INTRAVENOUS | Status: AC
  Filled 2021-06-03: qty 60

## 2021-06-03 MED ORDER — FENTANYL CITRATE (PF) 100 MCG/2ML IJ SOLN
50.0000 ug | Freq: Once | INTRAMUSCULAR | Status: AC
Start: 2021-06-03 — End: 2021-06-03
  Administered 2021-06-03: 50 ug via INTRAVENOUS

## 2021-06-03 MED ORDER — FENTANYL CITRATE (PF) 100 MCG/2ML IJ SOLN: 25.0000 ug | INTRAMUSCULAR | Status: DC | PRN

## 2021-06-03 MED ORDER — ONDANSETRON HCL 4 MG/2ML IJ SOLN
INTRAMUSCULAR | Status: AC
  Filled 2021-06-03: qty 2

## 2021-06-03 MED ORDER — OXYCODONE HCL 5 MG/5ML PO SOLN: 5.0000 mg | Freq: Once | ORAL | Status: DC | PRN

## 2021-06-03 MED ORDER — FENTANYL CITRATE (PF) 100 MCG/2ML IJ SOLN
INTRAMUSCULAR | Status: AC
  Filled 2021-06-03: qty 2

## 2021-06-03 MED ORDER — MIDAZOLAM HCL 2 MG/2ML IJ SOLN
INTRAMUSCULAR | Status: AC
  Filled 2021-06-03: qty 2

## 2021-06-03 MED ORDER — CEFAZOLIN SODIUM-DEXTROSE 2-4 GM/100ML-% IV SOLN
INTRAVENOUS | Status: AC
  Filled 2021-06-03: qty 100

## 2021-06-03 MED ORDER — BUPIVACAINE HCL (PF) 0.25 % IJ SOLN
INTRAMUSCULAR | Status: AC
  Filled 2021-06-03: qty 30

## 2021-06-03 MED ORDER — ONDANSETRON HCL 4 MG/2ML IJ SOLN
INTRAMUSCULAR | Status: DC | PRN
  Administered 2021-06-03: 4 mg via INTRAVENOUS

## 2021-06-03 MED ORDER — CEFAZOLIN SODIUM-DEXTROSE 2-4 GM/100ML-% IV SOLN
2.0000 g | INTRAVENOUS | Status: AC
  Administered 2021-06-03: 2 g via INTRAVENOUS

## 2021-06-03 MED ORDER — ACETAMINOPHEN 500 MG PO TABS: 1000.0000 mg | ORAL_TABLET | Freq: Once | ORAL | Status: DC

## 2021-06-03 MED ORDER — OXYCODONE HCL 5 MG PO TABS: 5.0000 mg | ORAL_TABLET | Freq: Once | ORAL | Status: DC | PRN

## 2021-06-03 SURGICAL SUPPLY — 50 items
APL PRP STRL LF DISP 70% ISPRP (MISCELLANEOUS) ×1
BIT DRILL 1.1 MINI (BIT) IMPLANT
BLADE MINI RND TIP GREEN BEAV (BLADE) IMPLANT
BLADE SURG 15 STRL LF DISP TIS (BLADE) ×1 IMPLANT
BLADE SURG 15 STRL SS (BLADE) ×3
BNDG CMPR 9X4 STRL LF SNTH (GAUZE/BANDAGES/DRESSINGS) ×1
BNDG COHESIVE 3X5 TAN STRL LF (GAUZE/BANDAGES/DRESSINGS) ×3 IMPLANT
BNDG ESMARK 4X9 LF (GAUZE/BANDAGES/DRESSINGS) ×3 IMPLANT
BNDG GAUZE ELAST 4 BULKY (GAUZE/BANDAGES/DRESSINGS) ×2 IMPLANT
CHLORAPREP W/TINT 26 (MISCELLANEOUS) ×3 IMPLANT
CORD BIPOLAR FORCEPS 12FT (ELECTRODE) ×3 IMPLANT
COVER BACK TABLE 60X90IN (DRAPES) ×3 IMPLANT
COVER MAYO STAND STRL (DRAPES) ×3 IMPLANT
COVER WAND RF STERILE (DRAPES) IMPLANT
CUFF TOURN SGL QUICK 18X4 (TOURNIQUET CUFF) ×2 IMPLANT
DECANTER SPIKE VIAL GLASS SM (MISCELLANEOUS) IMPLANT
DRAPE EXTREMITY T 121X128X90 (DISPOSABLE) ×3 IMPLANT
DRAPE OEC MINIVIEW 54X84 (DRAPES) ×3 IMPLANT
DRAPE SURG 17X23 STRL (DRAPES) ×3 IMPLANT
DRILL BIT 1.1 MINI (BIT) ×3
GAUZE SPONGE 4X4 12PLY STRL (GAUZE/BANDAGES/DRESSINGS) ×3 IMPLANT
GAUZE XEROFORM 1X8 LF (GAUZE/BANDAGES/DRESSINGS) ×3 IMPLANT
GLOVE SURG ORTHO LTX SZ8 (GLOVE) ×3 IMPLANT
GLOVE SURG UNDER POLY LF SZ8.5 (GLOVE) ×3 IMPLANT
GOWN STRL REUS W/ TWL LRG LVL3 (GOWN DISPOSABLE) IMPLANT
GOWN STRL REUS W/TWL LRG LVL3 (GOWN DISPOSABLE) ×3
GOWN STRL REUS W/TWL XL LVL3 (GOWN DISPOSABLE) ×5 IMPLANT
NDL PRECISIONGLIDE 27X1.5 (NEEDLE) IMPLANT
NEEDLE PRECISIONGLIDE 27X1.5 (NEEDLE) IMPLANT
NS IRRIG 1000ML POUR BTL (IV SOLUTION) ×3 IMPLANT
PACK BASIN DAY SURGERY FS (CUSTOM PROCEDURE TRAY) ×3 IMPLANT
PAD CAST 3X4 CTTN HI CHSV (CAST SUPPLIES) ×1 IMPLANT
PADDING CAST ABS 4INX4YD NS (CAST SUPPLIES)
PADDING CAST ABS COTTON 4X4 ST (CAST SUPPLIES) ×1 IMPLANT
PADDING CAST COTTON 3X4 STRL (CAST SUPPLIES) ×3
SCREW CORTEX 1.5X11 (Screw) ×2 IMPLANT
SCREW CORTEX 1.5X12 (Screw) ×4 IMPLANT
SLEEVE SCD COMPRESS KNEE MED (STOCKING) IMPLANT
SLING ARM FOAM STRAP LRG (SOFTGOODS) ×2 IMPLANT
SPLINT PLASTER CAST XFAST 3X15 (CAST SUPPLIES) IMPLANT
SPLINT PLASTER XTRA FASTSET 3X (CAST SUPPLIES) ×40
STOCKINETTE 4X48 STRL (DRAPES) ×3 IMPLANT
SUT CHROMIC 5 0 P 3 (SUTURE) IMPLANT
SUT ETHILON 4 0 PS 2 18 (SUTURE) ×3 IMPLANT
SUT MERSILENE 4 0 P 3 (SUTURE) ×2 IMPLANT
SUT VICRYL 4-0 PS2 18IN ABS (SUTURE) ×2 IMPLANT
SYR BULB EAR ULCER 3OZ GRN STR (SYRINGE) ×3 IMPLANT
SYR CONTROL 10ML LL (SYRINGE) IMPLANT
TOWEL GREEN STERILE FF (TOWEL DISPOSABLE) ×3 IMPLANT
UNDERPAD 30X36 HEAVY ABSORB (UNDERPADS AND DIAPERS) ×3 IMPLANT

## 2021-06-03 NOTE — Anesthesia Procedure Notes (Signed)
Anesthesia Regional Block: Supraclavicular block   Pre-Anesthetic Checklist: , timeout performed,  Correct Patient, Correct Site, Correct Laterality,  Correct Procedure, Correct Position, site marked,  Risks and benefits discussed,  Pre-op evaluation,  At surgeon's request and post-op pain management  Laterality: Right  Prep: Maximum Sterile Barrier Precautions used, chloraprep       Needles:  Injection technique: Single-shot  Needle Type: Echogenic Stimulator Needle     Needle Length: 9cm  Needle Gauge: 22     Additional Needles:   Procedures:,,,, ultrasound used (permanent image in chart),,    Narrative:  Start time: 06/03/2021 2:03 PM End time: 06/03/2021 2:06 PM Injection made incrementally with aspirations every 5 mL.  Performed by: Personally  Anesthesiologist: Brennan Bailey, MD  Additional Notes: Risks, benefits, and alternative discussed. Patient gave consent for procedure. Patient prepped and draped in sterile fashion. Sedation administered, patient remains easily responsive to voice. Relevant anatomy identified with ultrasound guidance. Local anesthetic given in 5cc increments with no signs or symptoms of intravascular injection. No pain or paraesthesias with injection. Patient monitored throughout procedure with signs of LAST or immediate complications. Tolerated well. Ultrasound image placed in chart.  Tawny Asal, MD

## 2021-06-03 NOTE — Discharge Instructions (Addendum)
  Post Anesthesia Home Care Instructions  Activity: Get plenty of rest for the remainder of the day. A responsible individual must stay with you for 24 hours following the procedure.  For the next 24 hours, DO NOT: -Drive a car -Paediatric nurse -Drink alcoholic beverages -Take any medication unless instructed by your physician -Make any legal decisions or sign important papers.  Meals: Start with liquid foods such as gelatin or soup. Progress to regular foods as tolerated. Avoid greasy, spicy, heavy foods. If nausea and/or vomiting occur, drink only clear liquids until the nausea and/or vomiting subsides. Call your physician if vomiting continues.  Special Instructions/Symptoms: Your throat may feel dry or sore from the anesthesia or the breathing tube placed in your throat during surgery. If this causes discomfort, gargle with warm salt water. The discomfort should disappear within 24 hours.  Regional Anesthesia Blocks  1. Numbness or the inability to move the "blocked" extremity may last from 3-48 hours after placement. The length of time depends on the medication injected and your individual response to the medication. If the numbness is not going away after 48 hours, call your surgeon.  2. The extremity that is blocked will need to be protected until the numbness is gone and the  Strength has returned. Because you cannot feel it, you will need to take extra care to avoid injury. Because it may be weak, you may have difficulty moving it or using it. You may not know what position it is in without looking at it while the block is in effect.  3. For blocks in the legs and feet, returning to weight bearing and walking needs to be done carefully. You will need to wait until the numbness is entirely gone and the strength has returned. You should be able to move your leg and foot normally before you try and bear weight or walk. You will need someone to be with you when you first try to ensure  you do not fall and possibly risk injury.  4. Bruising and tenderness at the needle site are common side effects and will resolve in a few days.  5. Persistent numbness or new problems with movement should be communicated to the surgeon or the [place] [phone] [place] [phone]). [facility] Instructions Hand Surgery  Wound Care: Keep your hand elevated above the level of your heart.  Do not allow it to dangle by your side.  Keep the dressing dry and do not remove it unless your doctor advises you to do so.  He will usually change it at the time of your post-op visit.  Moving your fingers is advised to stimulate circulation but will depend on the site of your surgery.  If you have a splint applied, your doctor will advise you regarding movement.  Activity: Do not drive or operate machinery today.  Rest today and then you may return to your normal activity and work as indicated by your physician.  Diet:  Drink liquids today or eat a light diet.  You may resume a regular diet tomorrow.    General expectations: Pain for two to three days. Fingers may become slightly swollen.  Call your doctor if any of the following occur: Severe pain not relieved by pain medication. Elevated temperature. Dressing soaked with blood. Inability to move fingers. White or bluish color to fingers.

## 2021-06-03 NOTE — Anesthesia Preprocedure Evaluation (Addendum)
Anesthesia Evaluation  Patient identified by MRN, date of birth, ID band Patient awake    Reviewed: Allergy & Precautions, NPO status , Patient's Chart, lab work & pertinent test results  History of Anesthesia Complications Negative for: history of anesthetic complications  Airway Mallampati: II  TM Distance: >3 FB Neck ROM: Full    Dental no notable dental hx.    Pulmonary neg pulmonary ROS,    Pulmonary exam normal        Cardiovascular hypertension, Pt. on medications Normal cardiovascular exam     Neuro/Psych negative neurological ROS  negative psych ROS   GI/Hepatic Neg liver ROS, GERD  Medicated and Controlled,  Endo/Other  negative endocrine ROS  Renal/GU negative Renal ROS  negative genitourinary   Musculoskeletal negative musculoskeletal ROS (+)   Abdominal   Peds  Hematology negative hematology ROS (+)   Anesthesia Other Findings Day of surgery medications reviewed with patient.  Reproductive/Obstetrics negative OB ROS                            Anesthesia Physical Anesthesia Plan  ASA: 2  Anesthesia Plan: MAC and Regional   Post-op Pain Management:    Induction:   PONV Risk Score and Plan: 1 and Treatment may vary due to age or medical condition, Propofol infusion and Ondansetron  Airway Management Planned: Natural Airway and Simple Face Mask  Additional Equipment: None  Intra-op Plan:   Post-operative Plan:   Informed Consent: I have reviewed the patients History and Physical, chart, labs and discussed the procedure including the risks, benefits and alternatives for the proposed anesthesia with the patient or authorized representative who has indicated his/her understanding and acceptance.     Dental advisory given  Plan Discussed with: CRNA  Anesthesia Plan Comments:        Anesthesia Quick Evaluation

## 2021-06-03 NOTE — Transfer of Care (Signed)
Immediate Anesthesia Transfer of Care Note  Patient: Mason Aguilar  Procedure(s) Performed: OPEN REDUCTION INTERNAL FIXATION (ORIF)RIGHT FIFTH  METACARPAL (Right: Hand)  Patient Location: PACU  Anesthesia Type:MAC combined with regional for post-op pain  Level of Consciousness: sedated  Airway & Oxygen Therapy: Patient Spontanous Breathing  Post-op Assessment: Report given to RN and Post -op Vital signs reviewed and stable  Post vital signs: Reviewed and stable  Last Vitals:  Vitals Value Taken Time  BP 114/62 06/03/21 1617  Temp    Pulse 75 06/03/21 1618  Resp 20 06/03/21 1618  SpO2 91 % 06/03/21 1618  Vitals shown include unvalidated device data.  Last Pain:  Vitals:   06/03/21 1415  TempSrc:   PainSc: Asleep      Patients Stated Pain Goal: 5 (93/[id number]/42 [id number])  Complications: No notable events documented.

## 2021-06-03 NOTE — Progress Notes (Signed)
Pt is not a good historian. Not familiar with med, health history, etc. Called niece "Roanna Epley" who verified meds taken and NPO status (NPO since MN except for med sip water). Roanna Epley says she will transport pt home and provide 24 hr care. Roanna Epley is with her mother (also having a surgical procedure) and will return to [facility] at 1530-1600 to take pt home.
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
## 2021-06-03 NOTE — Progress Notes (Signed)
Assisted Dr. Howze with left, ultrasound guided, supraclavicular block. Side rails up, monitors on throughout procedure. See vital signs in flow sheet. Tolerated Procedure well. 
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
## 2021-06-03 NOTE — Op Note (Signed)
I assisted Surgeon(s) and Role:    * Daryll Brod, MD - Primary    Leanora Cover, MD - Assisting on the Procedure(s): OPEN REDUCTION INTERNAL FIXATION (ORIF)RIGHT FIFTH  METACARPAL on 06/03/2021.  I provided assistance on this case as follows: clearing and reduction of fracture, placement hardware.  Electronically signed by: Leanora Cover, MD Date: 06/03/2021 Time: 4:13 PM

## 2021-06-03 NOTE — Brief Op Note (Signed)
06/03/2021  4:13 PM  PATIENT:  Mason Aguilar  75 y.o. male  PRE-OPERATIVE DIAGNOSIS:  FRACTURE OF RIGHT FIFTH METACARPAL  POST-OPERATIVE DIAGNOSIS:  FRACTURE OF RIGHT FIFTH METACARPAL  PROCEDURE:  Procedure(s) with comments: OPEN REDUCTION INTERNAL FIXATION (ORIF)RIGHT FIFTH  METACARPAL (Right) - AXILLARY BLOCK  SURGEON:  Surgeon(s) and Role:    * Daryll Brod, MD - Primary    * Leanora Cover, MD - Assisting  PHYSICIAN ASSISTANT:   ASSISTANTS: K Bailen Geffre,MD   ANESTHESIA:   regional and IV sedation  EBL:  22ml  BLOOD ADMINISTERED:none  DRAINS: none   LOCAL MEDICATIONS USED:  NONE  SPECIMEN:  No Specimen  DISPOSITION OF SPECIMEN:  N/A  COUNTS:  YES  TOURNIQUET:   Total Tourniquet Time Documented: Upper Arm (Right) - 48 minutes Total: Upper Arm (Right) - 48 minutes   DICTATION: .Viviann Spare Dictation  PLAN OF CARE: Discharge to home after PACU  PATIENT DISPOSITION:  PACU - hemodynamically stable.

## 2021-06-04 ENCOUNTER — Encounter (HOSPITAL_BASED_OUTPATIENT_CLINIC_OR_DEPARTMENT_OTHER): Payer: Self-pay | Admitting: Orthopedic Surgery

## 2021-06-06 ENCOUNTER — Encounter (HOSPITAL_BASED_OUTPATIENT_CLINIC_OR_DEPARTMENT_OTHER): Payer: Self-pay | Admitting: Orthopedic Surgery

## 2021-06-06 NOTE — Anesthesia Postprocedure Evaluation (Signed)
Anesthesia Post Note  Patient: Mason Aguilar  Procedure(s) Performed: OPEN REDUCTION INTERNAL FIXATION (ORIF)RIGHT FIFTH  METACARPAL (Right: Hand)     Patient location during evaluation: PACU Anesthesia Type: Regional Level of consciousness: awake and alert Pain management: pain level controlled Vital Signs Assessment: post-procedure vital signs reviewed and stable Respiratory status: spontaneous breathing Cardiovascular status: stable Anesthetic complications: no   No notable events documented.  Last Vitals:  Vitals:   06/03/21 1640 06/03/21 1704  BP: 130/72 127/74  Pulse: 69 66  Resp: 15 16  Temp:  36.6 C  SpO2: 94% 96%    Last Pain:  Vitals:   06/03/21 1653  TempSrc:   PainSc: 0-No pain                 Nolon Nations

## 2021-06-09 NOTE — Addendum Note (Signed)
Addended by: Festus Aloe R on: 06/09/2021 02:26 PM   Modules accepted: Orders

## 2021-07-07 ENCOUNTER — Ambulatory Visit (HOSPITAL_COMMUNITY)
Admission: RE | Admit: 2021-07-07 | Discharge: 2021-07-07 | Disposition: A | Discharge status: Home / Self Care | Payer: Medicare HMO | Source: Ambulatory Visit | Attending: Urology | Admitting: Urology

## 2021-07-07 ENCOUNTER — Other Ambulatory Visit: Payer: Self-pay

## 2021-07-07 DIAGNOSIS — N2889 Other specified disorders of kidney and ureter: Secondary | ICD-10-CM | POA: Insufficient documentation

## 2021-07-07 MED ORDER — GADOBUTROL 1 MMOL/ML IV SOLN
10.0000 mL | Freq: Once | INTRAVENOUS | Status: AC | PRN
  Administered 2021-07-07: 10 mL via INTRAVENOUS

## 2021-08-09 ENCOUNTER — Other Ambulatory Visit: Payer: Medicare HMO | Admitting: Urology

## 2021-09-26 ENCOUNTER — Emergency Department (HOSPITAL_COMMUNITY)
Admission: EM | Admit: 2021-09-26 | Discharge: 2021-09-27 | Disposition: A | Discharge status: Home / Self Care | Payer: Medicare HMO | Attending: Emergency Medicine | Admitting: Emergency Medicine

## 2021-09-26 ENCOUNTER — Emergency Department (HOSPITAL_COMMUNITY): Payer: Medicare HMO

## 2021-09-26 ENCOUNTER — Encounter (HOSPITAL_COMMUNITY): Payer: Self-pay | Admitting: Emergency Medicine

## 2021-09-26 ENCOUNTER — Other Ambulatory Visit: Payer: Self-pay

## 2021-09-26 DIAGNOSIS — I10 Essential (primary) hypertension: Secondary | ICD-10-CM | POA: Insufficient documentation

## 2021-09-26 DIAGNOSIS — Z7982 Long term (current) use of aspirin: Secondary | ICD-10-CM | POA: Diagnosis not present

## 2021-09-26 DIAGNOSIS — Z8546 Personal history of malignant neoplasm of prostate: Secondary | ICD-10-CM | POA: Insufficient documentation

## 2021-09-26 DIAGNOSIS — R310 Gross hematuria: Secondary | ICD-10-CM

## 2021-09-26 DIAGNOSIS — R109 Unspecified abdominal pain: Secondary | ICD-10-CM | POA: Diagnosis not present

## 2021-09-26 DIAGNOSIS — Z79899 Other long term (current) drug therapy: Secondary | ICD-10-CM | POA: Diagnosis not present

## 2021-09-26 DIAGNOSIS — R319 Hematuria, unspecified: Secondary | ICD-10-CM | POA: Diagnosis present

## 2021-09-26 LAB — CBC WITH DIFFERENTIAL/PLATELET
Abs Immature Granulocytes: 0.06 10*3/uL (ref 0.00–0.07)
Basophils Absolute: 0 10*3/uL (ref 0.0–0.1)
Basophils Relative: 0 %
Eosinophils Absolute: 0.1 10*3/uL (ref 0.0–0.5)
Eosinophils Relative: 0 %
HCT: 43.7 % (ref 39.0–52.0)
Hemoglobin: 14.2 g/dL (ref 13.0–17.0)
Immature Granulocytes: 0 %
Lymphocytes Relative: 51 %
Lymphs Abs: 7.3 10*3/uL — ABNORMAL HIGH (ref 0.7–4.0)
MCH: 28 pg (ref 26.0–34.0)
MCHC: 32.5 g/dL (ref 30.0–36.0)
MCV: 86.2 fL (ref 80.0–100.0)
Monocytes Absolute: 0.6 10*3/uL (ref 0.1–1.0)
Monocytes Relative: 4 %
Neutro Abs: 6.6 10*3/uL (ref 1.7–7.7)
Neutrophils Relative %: 45 %
Platelets: 193 10*3/uL (ref 150–400)
RBC: 5.07 MIL/uL (ref 4.22–5.81)
RDW: 13.2 % (ref 11.5–15.5)
WBC: 14.9 10*3/uL — ABNORMAL HIGH (ref 4.0–10.5)
nRBC: 0 % (ref 0.0–0.2)

## 2021-09-26 LAB — BASIC METABOLIC PANEL
Anion gap: 9 (ref 5–15)
BUN: 9 mg/dL (ref 8–23)
CO2: 22 mmol/L (ref 22–32)
Calcium: 8.6 mg/dL — ABNORMAL LOW (ref 8.9–10.3)
Chloride: 105 mmol/L (ref 98–111)
Creatinine, Ser: 0.95 mg/dL (ref 0.61–1.24)
GFR, Estimated: >60 mL/min (ref >60)
Glucose, Bld: 116 mg/dL — ABNORMAL HIGH (ref 70–99)
Potassium: 3.9 mmol/L (ref 3.5–5.1)
Sodium: 136 mmol/L (ref 135–145)

## 2021-09-26 LAB — URINALYSIS, ROUTINE W REFLEX MICROSCOPIC
Bacteria, UA: NONE SEEN
Bilirubin Urine: NEGATIVE
Glucose, UA: 50 mg/dL — AB
Ketones, ur: 5 mg/dL — AB
Leukocytes,Ua: NEGATIVE
Nitrite: NEGATIVE
Protein, ur: 100 mg/dL — AB
RBC / HPF: >50 RBC/hpf — ABNORMAL HIGH (ref 0–5)
Specific Gravity, Urine: 1.011 (ref 1.005–1.030)
pH: 6 (ref 5.0–8.0)

## 2021-09-26 NOTE — ED Triage Notes (Addendum)
Pt has been passing blood in urine all day today. States he is passing a lot of clots. Pt with hx of prostate cancer. Pt also has had some R sided flank pain per niece for past few days. Niece states that they found some new spots on a CT recently and that he has an appointment with his urologist tomorrow.

## 2021-09-26 NOTE — ED Provider Notes (Signed)
[facility] EMERGENCY DEPARTMENT Provider Note   CSN: [id number] Arrival date & time: 09/26/21  2043     History Chief Complaint  Patient presents with   Hematuria    Mason Aguilar is a 75 y.o. male.   Hematuria Pertinent negatives include no chest pain, no abdominal pain, no headaches and no shortness of breath.       Mason Aguilar is a 75 y.o. male with past medical history of hypertension, prostate cancer and hypercholesterolemia who presents to the Emergency Department complaining of painless hematuria.  He states that he has been urinating blood all day today.  He states he is passing large blood clots as well.  He states he has been having some right flank pain as well.  Diagnosed with prostate cancer in 2015 diagnosed in [place].  He underwent radiation therapy and he has been in remission per his niece who is his caregiver, but recently noted to have elevated PSA levels.  He is scheduled to see urology here in [place] tomorrow.  Patient states that he has had 2 prior episodes of hematuria since his prostate cancer diagnosis that spontaneously resolved.  He denies abdominal pain, nausea, vomiting, and pain with urination.  No dizziness or syncope.   Past Medical History:  Diagnosis Date   HOH (hard of hearing)    Hypercholesteremia    Hypertension    Prostate cancer [facility])     Patient Active Problem List   Diagnosis Date Noted   Closed displaced fracture of neck of right fifth metacarpal bone 06/02/2021   Gross hematuria 11/11/2020   Benign essential HTN 01/14/2016   Benign prostatic hyperplasia with lower urinary tract symptoms 05/11/2015   Malignant neoplasm of prostate ([place]) 10/30/2014   Elevated prostate specific antigen (PSA) 09/10/2014    Past Surgical History:  Procedure Laterality Date   arm surgery     OPEN REDUCTION INTERNAL FIXATION (ORIF) METACARPAL Right 06/03/2021   Procedure: OPEN REDUCTION INTERNAL FIXATION (ORIF)RIGHT FIFTH   METACARPAL;  Surgeon: Daryll Brod, MD;  Location: [facility];  Service: Orthopedics;  Laterality: Right;  AXILLARY BLOCK   polyps         History reviewed. No pertinent family history.  Social History   Tobacco Use   Smoking status: Never   Smokeless tobacco: Never  Vaping Use   Vaping Use: Never used  Substance Use Topics   Alcohol use: No   Drug use: No    Home Medications Prior to Admission medications   Medication Sig Start Date End Date Taking? Authorizing Provider  alendronate (FOSAMAX) 70 MG tablet Take 70 mg by mouth once a week. 03/05/21   [provider]  aspirin EC 81 MG tablet Take 81 mg by mouth daily. Swallow whole.    [provider]  cetirizine (ZYRTEC) 10 MG tablet Take 10 mg by mouth daily.    [provider]  Cholecalciferol (VITAMIN D-3) 25 MCG (1000 UT) CAPS Take by mouth.    [provider]  gabapentin (NEURONTIN) 300 MG capsule Take 1 capsule by mouth 3 (three) times daily. 05/03/21   [provider]  losartan (COZAAR) 50 MG tablet Take 1 tablet by mouth daily. 12/28/20   [provider]  omeprazole (PRILOSEC) 40 MG capsule Take 1 capsule by mouth daily. 01/24/21   [provider]  oxyCODONE-acetaminophen (PERCOCET) 5-325 MG tablet Take 1 tablet by mouth every 4 (four) hours as needed for severe pain. 06/03/21 06/03/22  Daryll Brod,  MD  simvastatin (ZOCOR) 40 MG tablet Take 40 mg by mouth at bedtime. 01/24/21   [provider]  tamsulosin (FLOMAX) 0.4 MG CAPS capsule Take 0.4 mg by mouth daily. 01/24/21   [provider]  tiZANidine (ZANAFLEX) 4 MG tablet  03/07/21   [provider]    Allergies    Patient has no known allergies.  Review of Systems   Review of Systems  Constitutional:  Negative for chills, fatigue and fever.  Respiratory:  Negative for shortness of breath.   Cardiovascular:  Negative for chest pain and palpitations.  Gastrointestinal:   Negative for abdominal pain, diarrhea, nausea and vomiting.  Genitourinary:  Positive for flank pain and hematuria. Negative for decreased urine volume, difficulty urinating, dysuria, penile discharge, penile swelling, scrotal swelling and testicular pain.  Musculoskeletal:  Negative for arthralgias, back pain, myalgias, neck pain and neck stiffness.  Skin:  Negative for rash.  Neurological:  Negative for dizziness, syncope, weakness, numbness and headaches.  Hematological:  Does not bruise/bleed easily.  Psychiatric/Behavioral:  Negative for confusion.    Physical Exam Updated Vital Signs BP (!) 147/80 (BP Location: Right Arm)   Pulse 95   Temp 98.8 F (37.1 C) (Oral)   Resp 16   Ht 5\' 7"  (1.702 m)   Wt 106 kg   SpO2 98%   BMI 36.60 kg/m   Physical Exam Vitals and nursing note reviewed.  Constitutional:      General: He is not in acute distress.    Appearance: Normal appearance. He is not ill-appearing.  Cardiovascular:     Rate and Rhythm: Normal rate and regular rhythm.     Pulses: Normal pulses.  Pulmonary:     Effort: Pulmonary effort is normal.  Chest:     Chest wall: No tenderness.  Musculoskeletal:        General: Normal range of motion.     Right lower leg: No edema.  Skin:    General: Skin is warm.     Capillary Refill: Capillary refill takes less than 2 seconds.  Neurological:     Mental Status: He is alert.    ED Results / Procedures / Treatments   Labs (all labs ordered are listed, but only abnormal results are displayed) Labs Reviewed  URINALYSIS, ROUTINE W REFLEX MICROSCOPIC - Abnormal; Notable for the following components:      Result Value   Color, Urine AMBER (*)    APPearance CLOUDY (*)    Glucose, UA 50 (*)    Hgb urine dipstick MODERATE (*)    Ketones, ur 5 (*)    Protein, ur 100 (*)    RBC / HPF >50 (*)    All other components within normal limits  BASIC METABOLIC PANEL - Abnormal; Notable for the following components:   Glucose, Bld 116  (*)    Calcium 8.6 (*)    All other components within normal limits  CBC WITH DIFFERENTIAL/PLATELET - Abnormal; Notable for the following components:   WBC 14.9 (*)    Lymphs Abs 7.3 (*)    All other components within normal limits    EKG None  Radiology No results found.  Procedures Procedures   Medications Ordered in ED Medications - No data to display  ED Course  I have reviewed the triage vital signs and the nursing notes.  Pertinent labs & imaging results that were available during my care of the patient were reviewed by me and considered in my medical decision making (see  chart for details).    MDM Rules/Calculators/A&P                           Patient here with known history of prostate cancer and rising PSA levels presents this evening with painless hematuria.  Also passing large blood clots.  On exam, patient well-appearing nontoxic.  Labs show no significant electrolyte derangement.  Leukocytosis of 14,000 similar to CBC from 4 months ago.  Urinalysis shows moderate hemoglobin with cloudy urine some proteinuria negative nitrites or leukocytes. CT renal stone study shows hyperdensity in the posterior bladder worrisome for hemorrhage cannot exclude a bladder lesion no hydro or urinary tract calculus. Post void residual shows 20 cc urine  Given that possible bladder hemorrhage seen on CT, will consult urology this evening for further recommendation for inserting Foley catheter.  [facility], Dr. Alyson Ingles.  He recommends dose of Rocephin this evening and urine culture.  He does not advise insertion of Foley catheter since patient is voiding well and not retaining urine.  Patient has appointment with Dr. Junious Silk for tomorrow I have discussed findings with the patient and contacted patient's niece, Mason Aguilar (who is caregiver ) and advised her of findings and recommendations.  She verbalized understanding and agrees to plan.   Final Clinical Impression(s) / ED  Diagnoses Final diagnoses:  Gross hematuria    Rx / DC Orders ED Discharge Orders     None        Bufford Lope 09/27/21 0011    Noemi Chapel, MD 09/27/21 1725

## 2021-09-27 ENCOUNTER — Encounter: Payer: Self-pay | Admitting: Urology

## 2021-09-27 ENCOUNTER — Ambulatory Visit (INDEPENDENT_AMBULATORY_CARE_PROVIDER_SITE_OTHER): Payer: Medicare HMO | Admitting: Urology

## 2021-09-27 VITALS — BP 136/85 | HR 101 | Temp 98.6°F | Ht 67.0 in | Wt 235.0 lb

## 2021-09-27 DIAGNOSIS — R310 Gross hematuria: Secondary | ICD-10-CM

## 2021-09-27 DIAGNOSIS — R9721 Rising PSA following treatment for malignant neoplasm of prostate: Secondary | ICD-10-CM

## 2021-09-27 LAB — URINALYSIS, ROUTINE W REFLEX MICROSCOPIC
Bilirubin, UA: NEGATIVE
Glucose, UA: NEGATIVE
Ketones, UA: NEGATIVE
Leukocytes,UA: NEGATIVE
Nitrite, UA: NEGATIVE
Specific Gravity, UA: 1.025 (ref 1.005–1.030)
Urobilinogen, Ur: 1 mg/dL (ref 0.2–1.0)
pH, UA: 6.5 (ref 5.0–7.5)

## 2021-09-27 LAB — MICROSCOPIC EXAMINATION
Bacteria, UA: NONE SEEN
Epithelial Cells (non renal): NONE SEEN /hpf (ref 0–10)
Renal Epithel, UA: NONE SEEN /hpf
WBC, UA: NONE SEEN /hpf (ref 0–5)

## 2021-09-27 MED ORDER — CIPROFLOXACIN HCL 500 MG PO TABS
500.0000 mg | ORAL_TABLET | Freq: Once | ORAL | Status: AC
  Administered 2021-09-27: 500 mg via ORAL

## 2021-09-27 MED ORDER — CEFTRIAXONE SODIUM 1 G IJ SOLR: 1.0000 g | Freq: Once | INTRAMUSCULAR | 0 refills | Status: AC

## 2021-09-27 MED ORDER — CEFTRIAXONE SODIUM 1 G IJ SOLR
1.0000 g | Freq: Once | INTRAMUSCULAR | Status: AC
  Administered 2021-09-27: 1 g via INTRAMUSCULAR
  Filled 2021-09-27: qty 10

## 2021-09-27 MED ORDER — STERILE WATER FOR INJECTION IJ SOLN
INTRAMUSCULAR | Status: AC
  Administered 2021-09-27: 2.1 mL
  Filled 2021-09-27: qty 10

## 2021-09-27 NOTE — Progress Notes (Signed)
Urological Symptom Review  Patient is experiencing the following symptoms: Frequent urination Blood in urine   Review of Systems  Gastrointestinal (upper)  : Negative for upper GI symptoms  Gastrointestinal (lower) : Negative for lower GI symptoms  Constitutional : Negative for symptoms  Skin: Negative for skin symptoms  Eyes: Negative for eye symptoms  Ear/Nose/Throat : Negative for Ear/Nose/Throat symptoms  Hematologic/Lymphatic: Negative for Hematologic/Lymphatic symptoms  Cardiovascular : Negative for cardiovascular symptoms  Respiratory : Negative for respiratory symptoms  Endocrine: Negative for endocrine symptoms  Musculoskeletal: Negative for musculoskeletal symptoms  Neurological: Negative for neurological symptoms  Psychologic: Negative for psychiatric symptoms

## 2021-09-27 NOTE — Discharge Instructions (Addendum)
You have been given a dose of antibiotics this evening.  Your lab work was reassuring.  Please keep your appointment tomorrow with Dr. Junious Silk.  Return to the emergency department for any new or worsening symptoms.
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
## 2021-09-28 LAB — URINE CULTURE: Culture: <10000 — AB

## 2021-09-28 LAB — PSA: Prostate Specific Ag, Serum: 1.5 ng/mL (ref 0.0–4.0)

## 2021-10-11 ENCOUNTER — Ambulatory Visit (HOSPITAL_COMMUNITY): Admission: RE | Admit: 2021-10-11 | Payer: Medicare HMO | Source: Ambulatory Visit

## 2021-11-10 ENCOUNTER — Ambulatory Visit (HOSPITAL_COMMUNITY)
Admission: RE | Admit: 2021-11-10 | Discharge: 2021-11-10 | Disposition: A | Discharge status: Home / Self Care | Payer: Medicare HMO | Source: Ambulatory Visit | Attending: Urology | Admitting: Urology

## 2021-11-10 ENCOUNTER — Other Ambulatory Visit: Payer: Self-pay

## 2021-11-10 DIAGNOSIS — C61 Malignant neoplasm of prostate: Secondary | ICD-10-CM | POA: Insufficient documentation

## 2021-11-10 MED ORDER — PIFLIFOLASTAT F 18 (PYLARIFY) INJECTION
9.0000 | Freq: Once | INTRAVENOUS | Status: AC
  Administered 2021-11-10: 9 via INTRAVENOUS

## 2021-11-15 ENCOUNTER — Encounter: Payer: Self-pay | Admitting: Urology

## 2021-11-15 ENCOUNTER — Ambulatory Visit (INDEPENDENT_AMBULATORY_CARE_PROVIDER_SITE_OTHER): Payer: Medicare HMO | Admitting: Urology

## 2021-11-15 ENCOUNTER — Other Ambulatory Visit: Payer: Self-pay

## 2021-11-15 VITALS — BP 148/92 | HR 83

## 2021-11-15 DIAGNOSIS — R310 Gross hematuria: Secondary | ICD-10-CM | POA: Diagnosis not present

## 2021-11-15 DIAGNOSIS — R9721 Rising PSA following treatment for malignant neoplasm of prostate: Secondary | ICD-10-CM

## 2021-11-15 MED ORDER — FINASTERIDE 5 MG PO TABS: 5.0000 mg | ORAL_TABLET | Freq: Every day | ORAL | 3 refills | Status: DC
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
## 2022-07-11 ENCOUNTER — Ambulatory Visit (INDEPENDENT_AMBULATORY_CARE_PROVIDER_SITE_OTHER): Payer: Medicare HMO | Admitting: Urology

## 2022-07-11 ENCOUNTER — Encounter: Payer: Self-pay | Admitting: Urology

## 2022-07-11 VITALS — BP 171/113 | HR 78 | Ht 67.0 in | Wt 245.0 lb

## 2022-07-11 DIAGNOSIS — R310 Gross hematuria: Secondary | ICD-10-CM | POA: Diagnosis not present

## 2022-07-11 DIAGNOSIS — Z8546 Personal history of malignant neoplasm of prostate: Secondary | ICD-10-CM

## 2022-07-11 LAB — URINALYSIS, ROUTINE W REFLEX MICROSCOPIC
Bilirubin, UA: NEGATIVE
Glucose, UA: NEGATIVE
Ketones, UA: NEGATIVE
Leukocytes,UA: NEGATIVE
Nitrite, UA: NEGATIVE
Protein,UA: NEGATIVE
RBC, UA: NEGATIVE
Specific Gravity, UA: 1.02 (ref 1.005–1.030)
Urobilinogen, Ur: 0.2 mg/dL (ref 0.2–1.0)
pH, UA: 6 (ref 5.0–7.5)
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
## 2022-07-12 LAB — PSA: Prostate Specific Ag, Serum: 1.6 ng/mL (ref 0.0–4.0)

## 2022-09-27 ENCOUNTER — Inpatient Hospital Stay (HOSPITAL_COMMUNITY)
Admission: EM | Admit: 2022-09-27 | Discharge: 2022-09-29 | DRG: 690 | Disposition: A | Discharge status: Home / Self Care | Payer: Medicare HMO | Attending: Family Medicine | Admitting: Family Medicine

## 2022-09-27 ENCOUNTER — Encounter (HOSPITAL_COMMUNITY): Payer: Self-pay | Admitting: Emergency Medicine

## 2022-09-27 ENCOUNTER — Emergency Department (HOSPITAL_COMMUNITY): Payer: Medicare HMO

## 2022-09-27 ENCOUNTER — Other Ambulatory Visit: Payer: Self-pay

## 2022-09-27 DIAGNOSIS — Z6836 Body mass index (BMI) 36.0-36.9, adult: Secondary | ICD-10-CM

## 2022-09-27 DIAGNOSIS — E872 Acidosis, unspecified: Secondary | ICD-10-CM | POA: Diagnosis present

## 2022-09-27 DIAGNOSIS — C61 Malignant neoplasm of prostate: Secondary | ICD-10-CM | POA: Diagnosis not present

## 2022-09-27 DIAGNOSIS — E7800 Pure hypercholesterolemia, unspecified: Secondary | ICD-10-CM | POA: Diagnosis present

## 2022-09-27 DIAGNOSIS — H9190 Unspecified hearing loss, unspecified ear: Secondary | ICD-10-CM | POA: Diagnosis present

## 2022-09-27 DIAGNOSIS — G8929 Other chronic pain: Secondary | ICD-10-CM | POA: Diagnosis present

## 2022-09-27 DIAGNOSIS — Z87448 Personal history of other diseases of urinary system: Secondary | ICD-10-CM

## 2022-09-27 DIAGNOSIS — R310 Gross hematuria: Secondary | ICD-10-CM | POA: Diagnosis present

## 2022-09-27 DIAGNOSIS — N3001 Acute cystitis with hematuria: Principal | ICD-10-CM | POA: Diagnosis present

## 2022-09-27 DIAGNOSIS — Z79899 Other long term (current) drug therapy: Secondary | ICD-10-CM | POA: Diagnosis not present

## 2022-09-27 DIAGNOSIS — E669 Obesity, unspecified: Secondary | ICD-10-CM | POA: Diagnosis present

## 2022-09-27 DIAGNOSIS — Z7982 Long term (current) use of aspirin: Secondary | ICD-10-CM

## 2022-09-27 DIAGNOSIS — Y842 Radiological procedure and radiotherapy as the cause of abnormal reaction of the patient, or of later complication, without mention of misadventure at the time of the procedure: Secondary | ICD-10-CM | POA: Diagnosis present

## 2022-09-27 DIAGNOSIS — Z8546 Personal history of malignant neoplasm of prostate: Secondary | ICD-10-CM

## 2022-09-27 DIAGNOSIS — R339 Retention of urine, unspecified: Secondary | ICD-10-CM | POA: Diagnosis present

## 2022-09-27 DIAGNOSIS — A419 Sepsis, unspecified organism: Secondary | ICD-10-CM | POA: Diagnosis not present

## 2022-09-27 DIAGNOSIS — N3041 Irradiation cystitis with hematuria: Secondary | ICD-10-CM | POA: Diagnosis present

## 2022-09-27 DIAGNOSIS — I10 Essential (primary) hypertension: Secondary | ICD-10-CM | POA: Diagnosis present

## 2022-09-27 DIAGNOSIS — R6520 Severe sepsis without septic shock: Secondary | ICD-10-CM | POA: Diagnosis present

## 2022-09-27 DIAGNOSIS — N390 Urinary tract infection, site not specified: Secondary | ICD-10-CM | POA: Diagnosis present

## 2022-09-27 DIAGNOSIS — N3040 Irradiation cystitis without hematuria: Secondary | ICD-10-CM | POA: Insufficient documentation

## 2022-09-27 DIAGNOSIS — Z923 Personal history of irradiation: Secondary | ICD-10-CM | POA: Diagnosis not present

## 2022-09-27 LAB — BASIC METABOLIC PANEL
Anion gap: 11 (ref 5–15)
BUN: 12 mg/dL (ref 8–23)
CO2: 19 mmol/L — ABNORMAL LOW (ref 22–32)
Calcium: 8.8 mg/dL — ABNORMAL LOW (ref 8.9–10.3)
Chloride: 107 mmol/L (ref 98–111)
Creatinine, Ser: 1.06 mg/dL (ref 0.61–1.24)
GFR, Estimated: >60 mL/min (ref >60)
Glucose, Bld: 141 mg/dL — ABNORMAL HIGH (ref 70–99)
Potassium: 4.3 mmol/L (ref 3.5–5.1)
Sodium: 137 mmol/L (ref 135–145)

## 2022-09-27 LAB — CBC
HCT: 44.7 % (ref 39.0–52.0)
HCT: 38.6 % — ABNORMAL LOW (ref 39.0–52.0)
Hemoglobin: 14.6 g/dL (ref 13.0–17.0)
Hemoglobin: 12.4 g/dL — ABNORMAL LOW (ref 13.0–17.0)
MCH: 28 pg (ref 26.0–34.0)
MCH: 27.7 pg (ref 26.0–34.0)
MCHC: 32.7 g/dL (ref 30.0–36.0)
MCHC: 32.1 g/dL (ref 30.0–36.0)
MCV: 85.6 fL (ref 80.0–100.0)
MCV: 86.2 fL (ref 80.0–100.0)
Platelets: 247 10*3/uL (ref 150–400)
Platelets: 202 10*3/uL (ref 150–400)
RBC: 5.22 MIL/uL (ref 4.22–5.81)
RBC: 4.48 MIL/uL (ref 4.22–5.81)
RDW: 13.5 % (ref 11.5–15.5)
RDW: 13.6 % (ref 11.5–15.5)
WBC: 24.5 10*3/uL — ABNORMAL HIGH (ref 4.0–10.5)
WBC: 21 10*3/uL — ABNORMAL HIGH (ref 4.0–10.5)
nRBC: 0 % (ref 0.0–0.2)
nRBC: 0 % (ref 0.0–0.2)

## 2022-09-27 LAB — URINALYSIS, ROUTINE W REFLEX MICROSCOPIC

## 2022-09-27 LAB — URINALYSIS, MICROSCOPIC (REFLEX)
Bacteria, UA: NONE SEEN
RBC / HPF: >50 RBC/hpf (ref 0–5)
Squamous Epithelial / HPF: NONE SEEN (ref 0–5)
WBC, UA: >50 WBC/hpf (ref 0–5)

## 2022-09-27 LAB — LACTIC ACID, PLASMA
Lactic Acid, Venous: 2.4 mmol/L (ref 0.5–1.9)
Lactic Acid, Venous: 1.5 mmol/L (ref 0.5–1.9)

## 2022-09-27 MED ORDER — OXYBUTYNIN CHLORIDE 5 MG PO TABS
5.0000 mg | ORAL_TABLET | Freq: Three times a day (TID) | ORAL | Status: DC | PRN
  Administered 2022-09-27: 5 mg via ORAL
  Filled 2022-09-27: qty 1

## 2022-09-27 MED ORDER — PANTOPRAZOLE SODIUM 40 MG PO TBEC
40.0000 mg | DELAYED_RELEASE_TABLET | Freq: Every day | ORAL | Status: DC
  Administered 2022-09-28 – 2022-09-29 (×2): 40 mg via ORAL
  Filled 2022-09-27 (×2): qty 1

## 2022-09-27 MED ORDER — MORPHINE SULFATE (PF) 4 MG/ML IV SOLN
4.0000 mg | Freq: Once | INTRAVENOUS | Status: AC
  Administered 2022-09-27: 4 mg via INTRAVENOUS
  Filled 2022-09-27: qty 1

## 2022-09-27 MED ORDER — ONDANSETRON HCL 4 MG/2ML IJ SOLN
4.0000 mg | Freq: Once | INTRAMUSCULAR | Status: AC
  Administered 2022-09-27: 4 mg via INTRAVENOUS
  Filled 2022-09-27: qty 2

## 2022-09-27 MED ORDER — ACETAMINOPHEN 650 MG RE SUPP: 650.0000 mg | Freq: Four times a day (QID) | RECTAL | Status: DC | PRN

## 2022-09-27 MED ORDER — SODIUM CHLORIDE 0.9 % IV SOLN: INTRAVENOUS | Status: AC

## 2022-09-27 MED ORDER — ONDANSETRON HCL 4 MG/2ML IJ SOLN: 4.0000 mg | Freq: Four times a day (QID) | INTRAMUSCULAR | Status: DC | PRN

## 2022-09-27 MED ORDER — OXYCODONE-ACETAMINOPHEN 5-325 MG PO TABS
2.0000 | ORAL_TABLET | Freq: Four times a day (QID) | ORAL | Status: DC | PRN
  Administered 2022-09-27: 2 via ORAL
  Filled 2022-09-27: qty 2

## 2022-09-27 MED ORDER — IOHEXOL 300 MG/ML  SOLN
100.0000 mL | Freq: Once | INTRAMUSCULAR | Status: AC | PRN
  Administered 2022-09-27: 100 mL via INTRAVENOUS

## 2022-09-27 MED ORDER — SODIUM CHLORIDE 0.9 % IV SOLN
1.0000 g | Freq: Once | INTRAVENOUS | Status: AC
  Administered 2022-09-27: 1 g via INTRAVENOUS
  Filled 2022-09-27: qty 10

## 2022-09-27 MED ORDER — TAMSULOSIN HCL 0.4 MG PO CAPS
0.4000 mg | ORAL_CAPSULE | Freq: Every day | ORAL | Status: DC
  Administered 2022-09-28 – 2022-09-29 (×2): 0.4 mg via ORAL
  Filled 2022-09-27 (×2): qty 1

## 2022-09-27 MED ORDER — FINASTERIDE 5 MG PO TABS
5.0000 mg | ORAL_TABLET | Freq: Every day | ORAL | Status: DC
  Administered 2022-09-28 – 2022-09-29 (×2): 5 mg via ORAL
  Filled 2022-09-27 (×2): qty 1

## 2022-09-27 MED ORDER — SODIUM CHLORIDE 0.9 % IV BOLUS
1000.0000 mL | Freq: Once | INTRAVENOUS | Status: AC
  Administered 2022-09-27: 1000 mL via INTRAVENOUS

## 2022-09-27 MED ORDER — LORATADINE 10 MG PO TABS
10.0000 mg | ORAL_TABLET | Freq: Every day | ORAL | Status: DC
  Administered 2022-09-28 – 2022-09-29 (×2): 10 mg via ORAL
  Filled 2022-09-27 (×2): qty 1

## 2022-09-27 MED ORDER — SODIUM CHLORIDE 0.9 % IV SOLN
1.0000 g | INTRAVENOUS | Status: DC
  Administered 2022-09-28: 1 g via INTRAVENOUS
  Filled 2022-09-27: qty 10

## 2022-09-27 MED ORDER — LIDOCAINE HCL URETHRAL/MUCOSAL 2 % EX GEL
1.0000 | Freq: Once | CUTANEOUS | Status: DC | PRN
  Filled 2022-09-27: qty 10
  Filled 2022-09-27: qty 30
  Filled 2022-09-27: qty 10

## 2022-09-27 MED ORDER — GABAPENTIN 300 MG PO CAPS
300.0000 mg | ORAL_CAPSULE | Freq: Three times a day (TID) | ORAL | Status: DC
  Administered 2022-09-27 – 2022-09-29 (×5): 300 mg via ORAL
  Filled 2022-09-27 (×5): qty 1

## 2022-09-27 MED ORDER — LORAZEPAM 2 MG/ML IJ SOLN
1.0000 mg | Freq: Once | INTRAMUSCULAR | Status: AC
  Administered 2022-09-27: 1 mg via INTRAVENOUS
  Filled 2022-09-27: qty 1

## 2022-09-27 MED ORDER — ACETAMINOPHEN 325 MG PO TABS: 650.0000 mg | ORAL_TABLET | Freq: Four times a day (QID) | ORAL | Status: DC | PRN

## 2022-09-27 MED ORDER — SODIUM CHLORIDE 0.9 % IR SOLN
3000.0000 mL | Status: DC
  Administered 2022-09-27: 3000 mL

## 2022-09-27 MED ORDER — POLYETHYLENE GLYCOL 3350 17 G PO PACK
17.0000 g | PACK | Freq: Every day | ORAL | Status: DC | PRN
  Administered 2022-09-29: 17 g via ORAL
  Filled 2022-09-27: qty 1

## 2022-09-27 MED ORDER — ONDANSETRON HCL 4 MG PO TABS: 4.0000 mg | ORAL_TABLET | Freq: Four times a day (QID) | ORAL | Status: DC | PRN

## 2022-09-27 MED ORDER — PHENAZOPYRIDINE HCL 100 MG PO TABS
200.0000 mg | ORAL_TABLET | Freq: Once | ORAL | Status: AC
  Administered 2022-09-27: 200 mg via ORAL
  Filled 2022-09-27: qty 2

## 2022-09-27 NOTE — ED Notes (Signed)
Dr

## 2022-09-27 NOTE — ED Provider Notes (Signed)
[facility] EMERGENCY DEPARTMENT Provider Note   CSN: [id number] Arrival date & time: 09/27/22  1010     History  Chief Complaint  Patient presents with   Urinary Retention    Mason Aguilar is a 76 y.o. male.  Pt is a 76 yo male with a pmhx significant for prostate cancer, hypercholesteremia, HTN, and HOH.  Pt has had several episodes of hematuria and has followed with Dr. Junious Silk (urology).  Cysto last October was benign.  Family said urology thinks he bleeds due to his radiation for his prostate cancer.  Pt did see urology last in July.  His urine was clear then.  Pt has been having diarrhea for the last few days.  He started having blood in his urine with clots this am.  Pt said he feels like he needs to urinate, but can't.  No fevers. Pt is not on blood thinners.           Home Medications Prior to Admission medications   Medication Sig Start Date End Date Taking? Authorizing Provider  alendronate (FOSAMAX) 70 MG tablet Take 70 mg by mouth once a week. 03/05/21  Yes [provider]  aspirin EC 81 MG tablet Take 81 mg by mouth daily. Swallow whole.   Yes [provider]  cetirizine (ZYRTEC) 10 MG tablet Take 10 mg by mouth daily.   Yes [provider]  Cholecalciferol (VITAMIN D-3) 25 MCG (1000 UT) CAPS Take 1 capsule by mouth daily.   Yes [provider]  finasteride (PROSCAR) 5 MG tablet Take 1 tablet (5 mg total) by mouth daily. 11/15/21  Yes Festus Aloe, MD  gabapentin (NEURONTIN) 300 MG capsule Take 1 capsule by mouth 3 (three) times daily. 05/03/21  Yes [provider]  losartan (COZAAR) 50 MG tablet Take 1 tablet by mouth daily. 12/28/20  Yes [provider]  omeprazole (PRILOSEC) 40 MG capsule Take 1 capsule by mouth daily. 01/24/21  Yes [provider]  oxyCODONE-acetaminophen (PERCOCET) 10-325 MG tablet Take 1 tablet by mouth 4 (four) times daily as needed for pain. 09/19/22  Yes [provider]   simvastatin (ZOCOR) 40 MG tablet Take 40 mg by mouth at bedtime. 01/24/21  Yes [provider]  tamsulosin (FLOMAX) 0.4 MG CAPS capsule Take 0.4 mg by mouth daily. 01/24/21  Yes [provider]  OZEMPIC, 0.25 OR 0.5 MG/DOSE, 2 MG/3ML SOPN Inject 0.5 mg into the skin once a week. Patient not taking: Reported on 09/27/2022 09/21/22   [provider]  phentermine (ADIPEX-P) 37.5 MG tablet Take 37.5 mg by mouth daily. Patient not taking: Reported on 09/27/2022 08/25/22   [provider]      Allergies    Patient has no known allergies.    Review of Systems   Review of Systems  Gastrointestinal:  Positive for diarrhea.  Genitourinary:  Positive for difficulty urinating and hematuria.  All other systems reviewed and are negative.   Physical Exam Updated Vital Signs BP 122/76   Pulse 90   Temp 99.5 F (37.5 C) (Rectal)   Resp 11   Ht '5\' 7"'$  (1.702 m)   Wt 106.6 kg   SpO2 93%   BMI 36.81 kg/m  Physical Exam Vitals and nursing note reviewed.  Constitutional:      Appearance: Normal appearance.  HENT:     Head: Normocephalic and atraumatic.     Right Ear: External ear normal.     Left Ear: External ear normal.  Nose: Nose normal.     Mouth/Throat:     Mouth: Mucous membranes are dry.  Eyes:     Extraocular Movements: Extraocular movements intact.     Conjunctiva/sclera: Conjunctivae normal.     Pupils: Pupils are equal, round, and reactive to light.  Cardiovascular:     Rate and Rhythm: Normal rate and regular rhythm.     Pulses: Normal pulses.     Heart sounds: Normal heart sounds.  Pulmonary:     Effort: Pulmonary effort is normal.     Breath sounds: Normal breath sounds.  Abdominal:     General: Abdomen is flat. Bowel sounds are normal.     Palpations: Abdomen is soft.  Musculoskeletal:        General: Normal range of motion.     Cervical back: Normal range of motion and neck supple.  Skin:    General: Skin is warm.     Capillary  Refill: Capillary refill takes less than 2 seconds.  Neurological:     General: No focal deficit present.     Mental Status: He is alert and oriented to person, place, and time.  Psychiatric:        Mood and Affect: Mood normal.        Behavior: Behavior normal.     ED Results / Procedures / Treatments   Labs (all labs ordered are listed, but only abnormal results are displayed) Labs Reviewed  URINALYSIS, ROUTINE W REFLEX MICROSCOPIC - Abnormal; Notable for the following components:      Result Value   Color, Urine RED (*)    APPearance TURBID (*)    Glucose, UA   (*)    Value: TEST NOT REPORTED DUE TO COLOR INTERFERENCE OF URINE PIGMENT   Hgb urine dipstick   (*)    Value: TEST NOT REPORTED DUE TO COLOR INTERFERENCE OF URINE PIGMENT   Bilirubin Urine   (*)    Value: TEST NOT REPORTED DUE TO COLOR INTERFERENCE OF URINE PIGMENT   Ketones, ur   (*)    Value: TEST NOT REPORTED DUE TO COLOR INTERFERENCE OF URINE PIGMENT   Protein, ur   (*)    Value: TEST NOT REPORTED DUE TO COLOR INTERFERENCE OF URINE PIGMENT   Nitrite   (*)    Value: TEST NOT REPORTED DUE TO COLOR INTERFERENCE OF URINE PIGMENT   Leukocytes,Ua   (*)    Value: TEST NOT REPORTED DUE TO COLOR INTERFERENCE OF URINE PIGMENT   All other components within normal limits  BASIC METABOLIC PANEL - Abnormal; Notable for the following components:   CO2 19 (*)    Glucose, Bld 141 (*)    Calcium 8.8 (*)    All other components within normal limits  CBC - Abnormal; Notable for the following components:   WBC 24.5 (*)    All other components within normal limits  LACTIC ACID, PLASMA - Abnormal; Notable for the following components:   Lactic Acid, Venous 2.4 (*)    All other components within normal limits  URINE CULTURE  CULTURE, BLOOD (ROUTINE X 2)  CULTURE, BLOOD (ROUTINE X 2)  LACTIC ACID, PLASMA  URINALYSIS, MICROSCOPIC (REFLEX)    EKG None  Radiology No results found.  Procedures Procedures    Medications  Ordered in ED Medications  lidocaine (XYLOCAINE) 2 % jelly 1 Application (has no administration in time range)  cefTRIAXone (ROCEPHIN) 1 g in sodium chloride 0.9 % 100 mL IVPB (1 g Intravenous New Bag/Given 09/27/22  1524)  sodium chloride irrigation 0.9 % 3,000 mL (has no administration in time range)  LORazepam (ATIVAN) injection 1 mg (has no administration in time range)  phenazopyridine (PYRIDIUM) tablet 200 mg (has no administration in time range)  sodium chloride 0.9 % bolus 1,000 mL (0 mLs Intravenous Stopped 09/27/22 1304)  morphine (PF) 4 MG/ML injection 4 mg (4 mg Intravenous Given 09/27/22 1237)  ondansetron (ZOFRAN) injection 4 mg (4 mg Intravenous Given 09/27/22 1235)  morphine (PF) 4 MG/ML injection 4 mg (4 mg Intravenous Given 09/27/22 1439)    ED Course/ Medical Decision Making/ A&P                           Medical Decision Making Amount and/or Complexity of Data Reviewed Labs: ordered. Radiology: ordered.  Risk Prescription drug management. Decision regarding hospitalization.   This patient presents to the ED for concern of urinary retention, this involves an extensive number of treatment options, and is a complaint that carries with it a high risk of complications and morbidity.  The differential diagnosis includes UTI, bladder tumor, other bladder d/o   Co morbidities that complicate the patient evaluation  prostate cancer, hypercholesteremia, HTN, and HOH   Additional history obtained:  Additional history obtained from epic chart review External records from outside source obtained and reviewed including family   Lab Tests:  I Ordered, and personally interpreted labs.  The pertinent results include:  cbc with wbc elevated at 24.5; bmp nl; ua grossly bloody; lactic 2.4   Imaging Studies ordered:  I ordered imaging studies including ct abd/pelvis which is pending at d/c  I agree with the radiologist interpretation   Cardiac Monitoring:  The patient  was maintained on a cardiac monitor.  I personally viewed and interpreted the cardiac monitored which showed an underlying rhythm of: sinus tachy   Medicines ordered and prescription drug management:  I ordered medication including morphine/zofran  for pain and nausea  Reevaluation of the patient after these medicines showed that the patient improved I have reviewed the patients home medicines and have made adjustments as needed   Test Considered:  ct   Critical Interventions:  foley   Consultations Obtained:  I requested consultation with the urologist (Dr. Felipa Eth),  and discussed lab and imaging findings as well as pertinent plan -he will see pt in the ED Pt d/w Dr. Denton Brick (triad) for admission   Problem List / ED Course:  Hematuria and urinary retention:  foley catheter placed.  Pt's urine bloody.  Initially, foley was draining well.  However, it clotted off again.  The nurse irrigated it and stopped draining again.  Then, the nurse placed a 3 way foley and started CBI.  Urology consulted. UTI:  pt does have an elevated wbc with an elevated lactic acid.  UA grossly bloody, but has >50 wbcs and he has dysuria.  Pt started on rocephin.  Bp ok and hr is better after fluids.  Cultures have been sent, but he does not appear to be septic.   Reevaluation:  After the interventions noted above, I reevaluated the patient and found that they have :improved   Social Determinants of Health:  Lives at home   Dispostion:  After consideration of the diagnostic results and the patients response to treatment, I feel that the patent would benefit from admission.          Final Clinical Impression(s) / ED Diagnoses Final diagnoses:  Gross hematuria  Urinary retention  Acute cystitis with hematuria    Rx / DC Orders ED Discharge Orders     None         Isla Pence, MD 09/27/22 1549

## 2022-09-27 NOTE — ED Notes (Signed)
Foley cath draining clear to pink tinged urine at this time. CBI continues. Patient resting with eyes closed.

## 2022-09-27 NOTE — Consult Note (Signed)
Urology Consult   Physician requesting consult: Isla Pence, MD  Reason for consult: Gross hematuria  History of Present Illness: Mason Aguilar is a 76 y.o. male seen in consultation from Dr. Gilford Raid for evaluation of gross hematuria.  His urologic history is significant for prostate cancer treated with radiation therapy and ADT in 2015.  His last PSA from July 2023 was 1.6.  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  He has been evaluated with CT imaging and cystoscopy.  He was admitted to the hospital in October 2022.  CT imaging showed a 2.3 cm posterior bladder mass versus clot.  He was evaluated with cystoscopy in October 2022 which showed no bladder mass, and friable prostate and bladder neck vessels.  He is followed by Dr. Junious Silk and was last seen in July 2023.  He presented to the emergency room today with gross hematuria and urinary retention.  He reports onset of hematuria yesterday.  A three-way Foley catheter was placed by the emergency room staff and the bladder was irrigated with return of multiple clots. Urinalysis showed >50 RBCs and >50 WBCs.  White blood cell count elevated at 24.2.   Past Medical History:  Diagnosis Date   HOH (hard of hearing)    Hypercholesteremia    Hypertension    Prostate cancer [facility])     Past Surgical History:  Procedure Laterality Date   arm surgery     OPEN REDUCTION INTERNAL FIXATION (ORIF) METACARPAL Right 06/03/2021   Procedure: OPEN REDUCTION INTERNAL FIXATION (ORIF)RIGHT FIFTH  METACARPAL;  Surgeon: Daryll Brod, MD;  Location: [place];  Service: Orthopedics;  Laterality: Right;  AXILLARY BLOCK   polyps      Medications:  Scheduled Meds: Continuous Infusions:  sodium chloride irrigation     PRN Meds:.lidocaine  Allergies: No Known Allergies  History reviewed. No pertinent family history.  Social History:  reports that he has never smoked. He has never used smokeless tobacco. He  reports that he does not drink alcohol and does not use drugs.  ROS: A complete review of systems was performed.  All systems are negative except for pertinent findings as noted.  Physical Exam:  Vital signs in last 24 hours: Temp:  [98.7 F (37.1 C)-99.5 F (37.5 C)] 99.5 F (37.5 C) (10/03 1239) Pulse Rate:  [90-120] 90 (10/03 1500) Resp:  [11-25] 11 (10/03 1500) BP: (108-167)/(75-110) 122/76 (10/03 1500) SpO2:  [92 %-98 %] 93 % (10/03 1500) Weight:  [106.6 kg] 106.6 kg (10/03 1127) GENERAL APPEARANCE:  Well appearing, well developed, well nourished, NAD HEENT:  Atraumatic, normocephalic, oropharynx clear NECK:  Supple  ABDOMEN:  Soft, minimally tender in SP area, no masses EXTREMITIES:  Moves all extremities well, without clubbing, cyanosis, or edema NEUROLOGIC:  Alert and oriented x 3, CN II-XII grossly intact MENTAL STATUS:  appropriate BACK:  Non-tender to palpation, No CVAT SKIN:  Warm, dry, and intact GU:  uncircumcised phallus, normal meatus, foley to CBI with light pink urine  Laboratory Data:  Recent Labs    09/27/22 1159  WBC 24.5*  HGB 14.6  HCT 44.7  PLT 247    Recent Labs    09/27/22 1159  NA 137  K 4.3  CL 107  GLUCOSE 141*  BUN 12  CALCIUM 8.8*  CREATININE 1.06     Results for orders placed or performed during the hospital encounter of 09/27/22 (from the past 24 hour(s))  Urinalysis, Routine w reflex microscopic Urine, Clean Catch  Status: Abnormal   Collection Time: 09/27/22 11:32 AM  Result Value Ref Range   Color, Urine RED (A) YELLOW   APPearance TURBID (A) CLEAR   Specific Gravity, Urine  1.005 - 1.030    TEST NOT REPORTED DUE TO COLOR INTERFERENCE OF URINE PIGMENT   pH  5.0 - 8.0    TEST NOT REPORTED DUE TO COLOR INTERFERENCE OF URINE PIGMENT   Glucose, UA (A) NEGATIVE mg/dL    TEST NOT REPORTED DUE TO COLOR INTERFERENCE OF URINE PIGMENT   Hgb urine dipstick (A) NEGATIVE    TEST NOT REPORTED DUE TO COLOR INTERFERENCE OF URINE  PIGMENT   Bilirubin Urine (A) NEGATIVE    TEST NOT REPORTED DUE TO COLOR INTERFERENCE OF URINE PIGMENT   Ketones, ur (A) NEGATIVE mg/dL    TEST NOT REPORTED DUE TO COLOR INTERFERENCE OF URINE PIGMENT   Protein, ur (A) NEGATIVE mg/dL    TEST NOT REPORTED DUE TO COLOR INTERFERENCE OF URINE PIGMENT   Nitrite (A) NEGATIVE    TEST NOT REPORTED DUE TO COLOR INTERFERENCE OF URINE PIGMENT   Leukocytes,Ua (A) NEGATIVE    TEST NOT REPORTED DUE TO COLOR INTERFERENCE OF URINE PIGMENT  Urinalysis, Microscopic (reflex)     Status: None   Collection Time: 09/27/22 11:32 AM  Result Value Ref Range   RBC / HPF >50 0 - 5 RBC/hpf   WBC, UA >50 0 - 5 WBC/hpf   Bacteria, UA NONE SEEN NONE SEEN   Squamous Epithelial / LPF NONE SEEN 0 - 5  Basic metabolic panel     Status: Abnormal   Collection Time: 09/27/22 11:59 AM  Result Value Ref Range   Sodium 137 135 - 145 mmol/L   Potassium 4.3 3.5 - 5.1 mmol/L   Chloride 107 98 - 111 mmol/L   CO2 19 (L) 22 - 32 mmol/L   Glucose, Bld 141 (H) 70 - 99 mg/dL   BUN 12 8 - 23 mg/dL   Creatinine, Ser 1.06 0.61 - 1.24 mg/dL   Calcium 8.8 (L) 8.9 - 10.3 mg/dL   GFR, Estimated >60 >60 mL/min   Anion gap 11 5 - 15  CBC     Status: Abnormal   Collection Time: 09/27/22 11:59 AM  Result Value Ref Range   WBC 24.5 (H) 4.0 - 10.5 K/uL   RBC 5.22 4.22 - 5.81 MIL/uL   Hemoglobin 14.6 13.0 - 17.0 g/dL   HCT 44.7 39.0 - 52.0 %   MCV 85.6 80.0 - 100.0 fL   MCH 28.0 26.0 - 34.0 pg   MCHC 32.7 30.0 - 36.0 g/dL   RDW 13.5 11.5 - 15.5 %   Platelets 247 150 - 400 K/uL   nRBC 0.0 0.0 - 0.2 %  Lactic acid, plasma     Status: Abnormal   Collection Time: 09/27/22  1:16 PM  Result Value Ref Range   Lactic Acid, Venous 2.4 (HH) 0.5 - 1.9 mmol/L  Lactic acid, plasma     Status: None   Collection Time: 09/27/22  2:39 PM  Result Value Ref Range   Lactic Acid, Venous 1.5 0.5 - 1.9 mmol/L   No results found for this or any previous visit (from the past 240 hour(s)).  Renal  Function: Recent Labs    09/27/22 1159  CREATININE 1.06   Estimated Creatinine Clearance: 70.1 mL/min (by C-G formula based on SCr of 1.06 mg/dL).  Radiologic Imaging: No results found.  Procedure: The patient had a 20 [place] three-way  Foley catheter in place.  I attempted to irrigate the catheter but had difficulty with return.  I elected to place a larger three-way catheter to improve drainage.  Under sterile conditions, a 24 [place] three-way coud hematuria catheter was placed without difficulty.  30 mils of sterile water was placed into the balloon.  The catheter irrigated easily with return of blood-tinged urine without clots.  The patient was having significant bladder spasms during irrigation.  Continuous bladder irrigation was then initiated at a slow rate with return of light pink urine.  Impression/Recommendation Gross hematuria with clot retention History of prostate cancer status post radiation therapy History of radiation cystitis -likely cause of gross hematuria Possible UTI  Continue Foley catheter Continuous bladder irrigation to keep urine light pink with hand irrigation as needed Urine culture Antibiotics pending results of urine culture Urology will follow while in hospital.  Michaelle Birks 09/27/2022, 4:30 PM

## 2022-09-27 NOTE — ED Notes (Signed)
Per Dr. Felipa Eth, keep at minimum rate as long as urine clear and draining.

## 2022-09-27 NOTE — ED Notes (Signed)
Pt in bed, pt has foley cath in place, pt reports occasional bladder spasm.  Irrigated pt's foley cath with aprox 600 ml of saline, per md request, equal amounts in and out, multiple small clots out, pt continues to have reddish/pink output. pt tolerated well. Changed foley bag.

## 2022-09-27 NOTE — ED Notes (Signed)
Cath irrigated with multiple large blood clots removed.

## 2022-09-27 NOTE — Assessment & Plan Note (Addendum)
Possible UTI meeting criteria for severe sepsis.  Unable to analyze UA due to urine pigment.  Significant leukocytosis of 24.5.  Tachycardic heart rate 90-120.  Tmax 99.5.  Lactic acidosis of 2.4 > 1.5.  No prior urine cultures on file. -Follow-up urine and blood cultures -Continue ceftriaxone 1 g daily -1 L bolus given, continue LR 100cc/hr x 20hrs

## 2022-09-27 NOTE — ED Triage Notes (Signed)
Pt presents with urinary retention since last, also blood and clots, with diarrhea, PMH includes prostate cancer.

## 2022-09-27 NOTE — Assessment & Plan Note (Addendum)
Several episodes of gross hematuria in the past.  Hemoglobin stable.  Vitals stable. On aspirin, not on anticoagulation.  He had cystoscopy October/2022 with Dr. Junious Silk-  moderate hyperplasia and borderline obstruction, small friable appearing prostate and bladder neck vessels.  No active bleeding.  He suspected that the bleeding was from the prostate area from prostate cancer recurrence or radiation prostatitis/cystitis.  Subsequent PET scan showed focal activity in the right seminal vesicle concerning for locally advanced prostate cancer, activity and right central gland the specific also concerning for cancer.  No metastatic disease. -Evaluated by Dr. Felipa Eth in ED, recommended continuous bladder irrigation to keep urine light pink with hand irrigation as needed. -CT abdomen and pelvis -concerning for mild cystitis, Recommend clinical correlation for possible radiation therapy changes in the pelvis, as this fat stranding may also be the sequela of radiation therapy changes.  Also concerns for metastatic disease with increase in size of bilateral external iliac chain lymph nodes. -Monitor Hgb -Continue oxybutynin

## 2022-09-27 NOTE — H&P (Addendum)
History and Physical    Cohen Boettner IZT:[id number] DOB: 1946-03-05 DOA: 09/27/2022  PCP: [name], MD   Patient coming from: Home  I have personally briefly reviewed patient's old medical records in [place]  Chief Complaint: Blood in urine  HPI: Mason Aguilar is a 76 y.o. male with medical history significant for prostate cancer, hypertension. Patient presented to the ED with complaints of inability to urinate, pain with urination and passing of blood in urine.  He also reports lower abdominal pain.  He has had several episodes of hematuria in the past.  He had cystoscopy October/2022 with Dr. Junious Silk, findings showed moderate hyperplasia and borderline obstruction, small friable appearing prostate and bladder neck vessels.  No active bleeding.  He suspected that the bleeding was from the prostate area from prostate cancer recurrence or radiation prostatitis/cystitis.  Subsequent PET scan showed focal activity in the right seminal vesicle concerning for locally advanced prostate cancer, activity and right central gland the specific also concerning for cancer.  No metastatic disease.  He takes aspirin, but is not on anticoagulation.  ED Course: Tmax 99.5.  Heart rate 93-120.  Respirate rate 11-25.  Blood pressure systolic 382-505.  O2 sats greater than 92% on room air. Leukocytosis of 24.5.  Hemoglobin stable at 14.6.  Lactic acidosis 2.4 > 1.5. CT abdomen and pelvis ordered pending. 1 L bolus given.  IV ceftriaxone started.   Three-way Foley catheter placed then bladder irrigation started return of multiple clots Patient seen in ED at bedside by urologist Dr. Felipa Eth.   Review of Systems: As per HPI all other systems reviewed and negative.  Past Medical History:  Diagnosis Date   HOH (hard of hearing)    Hypercholesteremia    Hypertension    Prostate cancer [facility])     Past Surgical History:  Procedure Laterality Date   arm surgery     OPEN REDUCTION INTERNAL  FIXATION (ORIF) METACARPAL Right 06/03/2021   Procedure: OPEN REDUCTION INTERNAL FIXATION (ORIF)RIGHT FIFTH  METACARPAL;  Surgeon: Daryll Brod, MD;  Location: [place];  Service: Orthopedics;  Laterality: Right;  AXILLARY BLOCK   polyps       reports that he has never smoked. He has never used smokeless tobacco. He reports that he does not drink alcohol and does not use drugs.  No Known Allergies  Prior to Admission medications   Medication Sig Start Date End Date Taking? Authorizing Provider  alendronate (FOSAMAX) 70 MG tablet Take 70 mg by mouth once a week. 03/05/21  Yes [provider]  aspirin EC 81 MG tablet Take 81 mg by mouth daily. Swallow whole.   Yes [provider]  cetirizine (ZYRTEC) 10 MG tablet Take 10 mg by mouth daily.   Yes [provider]  Cholecalciferol (VITAMIN D-3) 25 MCG (1000 UT) CAPS Take 1 capsule by mouth daily.   Yes [provider]  finasteride (PROSCAR) 5 MG tablet Take 1 tablet (5 mg total) by mouth daily. 11/15/21  Yes Festus Aloe, MD  gabapentin (NEURONTIN) 300 MG capsule Take 1 capsule by mouth 3 (three) times daily. 05/03/21  Yes [provider]  losartan (COZAAR) 50 MG tablet Take 1 tablet by mouth daily. 12/28/20  Yes [provider]  omeprazole (PRILOSEC) 40 MG capsule Take 1 capsule by mouth daily. 01/24/21  Yes [provider]  oxyCODONE-acetaminophen (PERCOCET) 10-325 MG tablet Take 1 tablet by mouth 4 (four) times daily as needed for pain. 09/19/22  Yes [provider]  simvastatin (ZOCOR) 40 MG tablet Take 40 mg by mouth at bedtime. 01/24/21  Yes [provider]  tamsulosin (FLOMAX) 0.4 MG CAPS capsule Take 0.4 mg by mouth daily. 01/24/21  Yes [provider]  OZEMPIC, 0.25 OR 0.5 MG/DOSE, 2 MG/3ML SOPN Inject 0.5 mg into the skin once a week. Patient not taking: Reported on 09/27/2022 09/21/22   [provider]  phentermine (ADIPEX-P) 37.5  MG tablet Take 37.5 mg by mouth daily. Patient not taking: Reported on 09/27/2022 08/25/22   [provider]    Physical Exam: Vitals:   09/27/22 1400 09/27/22 1430 09/27/22 1445 09/27/22 1500  BP: (!) 167/101 136/81 108/75 122/76  Pulse: 93 99 93 90  Resp: (!) '22 18 15 11  '$ Temp:      TempSrc:      SpO2:  93%  93%  Weight:      Height:        Constitutional: NAD, calm, comfortable Vitals:   09/27/22 1400 09/27/22 1430 09/27/22 1445 09/27/22 1500  BP: (!) 167/101 136/81 108/75 122/76  Pulse: 93 99 93 90  Resp: (!) '22 18 15 11  '$ Temp:      TempSrc:      SpO2:  93%  93%  Weight:      Height:       Eyes: PERRL, lids and conjunctivae normal ENMT: Mucous membranes are moist.   Neck: normal, supple, no masses, no thyromegaly Respiratory: clear to auscultation bilaterally, no wheezing, no crackles.   Cardiovascular: Regular rate and rhythm, no murmurs / rubs / gallops. No extremity edema. 2+ pedal pulses.  Abdomen: no tenderness,  full, no masses palpated. No hepatosplenomegaly.  Foley catheter at draining pink-tinged urine Musculoskeletal: no clubbing / cyanosis. No joint deformity upper and lower extremities.  Skin: no rashes, lesions, ulcers. No induration Neurologic: No apparent cranial nerve abnormality moving extremity spontaneously.  Psychiatric: Normal judgment and insight. Alert and oriented x 3. Normal mood.   Labs on Admission: I have personally reviewed following labs and imaging studies  CBC: Recent Labs  Lab 09/27/22 1159  WBC 24.5*  HGB 14.6  HCT 44.7  MCV 85.6  PLT 992   Basic Metabolic Panel: Recent Labs  Lab 09/27/22 1159  NA 137  K 4.3  CL 107  CO2 19*  GLUCOSE 141*  BUN 12  CREATININE 1.06  CALCIUM 8.8*   Urine analysis:    Component Value Date/Time   COLORURINE RED (A) 09/27/2022 1132   APPEARANCEUR TURBID (A) 09/27/2022 1132   APPEARANCEUR Clear 07/11/2022 1541   LABSPEC  09/27/2022 1132    TEST NOT REPORTED DUE TO COLOR  INTERFERENCE OF URINE PIGMENT   PHURINE  09/27/2022 1132    TEST NOT REPORTED DUE TO COLOR INTERFERENCE OF URINE PIGMENT   GLUCOSEU (A) 09/27/2022 1132    TEST NOT REPORTED DUE TO COLOR INTERFERENCE OF URINE PIGMENT   HGBUR (A) 09/27/2022 1132    TEST NOT REPORTED DUE TO COLOR INTERFERENCE OF URINE PIGMENT   BILIRUBINUR (A) 09/27/2022 1132    TEST NOT REPORTED DUE TO COLOR INTERFERENCE OF URINE PIGMENT   BILIRUBINUR Negative 07/11/2022 1541   KETONESUR (A) 09/27/2022 1132    TEST NOT REPORTED DUE TO COLOR INTERFERENCE OF URINE PIGMENT   PROTEINUR (A) 09/27/2022 1132    TEST NOT REPORTED DUE TO COLOR INTERFERENCE OF URINE PIGMENT   NITRITE (A) 09/27/2022 1132    TEST NOT REPORTED DUE TO COLOR INTERFERENCE OF URINE PIGMENT  LEUKOCYTESUR (A) 09/27/2022 1132    TEST NOT REPORTED DUE TO COLOR INTERFERENCE OF URINE PIGMENT    Radiological Exams on Admission: CT ABDOMEN PELVIS W CONTRAST  Result Date: 09/27/2022 CLINICAL DATA:  Gross microscopic hematuria. Urinary retention. History of prostate cancer. EXAM: CT ABDOMEN AND PELVIS WITH CONTRAST TECHNIQUE: Multidetector CT imaging of the abdomen and pelvis was performed using the standard protocol following bolus administration of intravenous contrast. RADIATION DOSE REDUCTION: This exam was performed according to the departmental dose-optimization program which includes automated exposure control, adjustment of the mA and/or kV according to patient size and/or use of iterative reconstruction technique. CONTRAST:  136m OMNIPAQUE IOHEXOL 300 MG/ML  SOLN COMPARISON:  CT abdomen and pelvis without contrast 09/26/2021; PET-CT 11/10/2021 FINDINGS: Lower chest: There is bilateral posterior lower lobe ground-glass subsegmental atelectasis. A 3 mm left lower lobe pulmonary nodule is unchanged from 09/2021 and 05/12/2021 prior CTs. This is likely benign and no follow-up imaging is recommended. Hepatobiliary: Smooth liver contours. No focal liver lesion is  seen. Tiny gallstones are again visualized. No inflammatory father while changes are seen. No intrahepatic or extrahepatic biliary ductal dilatation. Pancreas: Unremarkable. No pancreatic ductal dilatation or surrounding inflammatory changes. Spleen: A small splenule is again seen inferolateral to the spleen. The spleen enhances homogeneously and is normal in size. Adrenals/Urinary Tract: Normal adrenals. The kidneys enhance uniformly and are symmetric in size without hydronephrosis. There is again a fluid density left mid to upper pole lateral cyst measuring up to 6.8 cm. No follow-up imaging is recommended. The bilateral ureters are normal in caliber. The urinary bladder is decompressed by indwelling Foley catheter. Mild fat stranding anterior to the urinary bladder appears new from prior 09/27/2021 noncontrast CT and 11/10/2021 noncontrast PET-CT. This may be secondary to mild cystitis. Recommend clinical correlation for possible radiation therapy in the pelvis, as this fat stranding may be the sequela of radiation therapy changes. On delayed phase images, contrast opacifies the bilateral renal collecting systems and the proximal aspect of the bilateral ureters without a filling defects seen. Stomach/Bowel: Mild-to-moderate sigmoid and mild descending colon diverticulosis without inflammatory changes to indicate diverticulitis, unchanged from prior. The terminal ileum is unremarkable. Normal caliber of the appendix without surrounding inflammatory fat stranding. At least 3 appendicoliths, measuring up to 2 mm (axial series 2, image 77) appear new from prior. Redemonstration of small sliding hiatal hernia. No dilated loops of bowel to indicate bowel obstruction. Vascular/Lymphatic: No abdominal aortic aneurysm. Mild-to-moderate vascular calcifications. The major intra-abdominal aortic branch vessels appear patent. A left external iliac 1.8 x 1.7 cm lymph node (axial series 2, image 83) is mildly increased from 1.3  x 1.2 cm on 09/26/2021 and measured in a similar manner. Enlarged right external iliac chain lymph nodes are again seen, overall increased in size from prior. Reference 2.1 x 2.0 cm right external iliac lymph node (axial series 2, image 82) is increased from 1.3 x 1.7 cm previously when measured in a similar manner. Multiple more proximal right external iliac chain lymph nodes are also increased in size compared to prior. Reproductive: The prostate is surgically absent. Prostate radiotherapy seeds are again seen. The seminal vesicles are present and appear unchanged. Other: Upper abdominal pain broad-mouthed, shallow midline ventral fat containing hernia with hernia orifice measuring up to approximately 6 cm x 7 cm (transverse by AP) and with the ventral herniated mesenteric fat measuring only 1 cm (axial series 2, image 44), unchanged from prior. Mild right-greater-than-left fat containing inguinal hernias are unchanged. No  free air or free fluid is seen within the abdomen or pelvis. Musculoskeletal: Moderate anterior T11 vertebral body height loss and moderate superior left-greater-than-right L2 endplate degenerative Schmorl's nodes are unchanged. No aggressive lytic or blastic osseous lesion is seen. IMPRESSION: Compared to 09/26/2021: 1. The urinary bladder is decompressed by indwelling Foley catheter. There is new mild fat stranding anterior to the urinary bladder. This may be secondary to mild cystitis. Recommend clinical correlation for possible radiation therapy changes in the pelvis, as this fat stranding may also be the sequela of radiation therapy changes. 2. Compared to 09/26/2021 and 11/10/2021 noncontrast CT, there is interval increase in size of bilateral external iliac chain lymphadenopathy. This is again concerning for metastatic disease. 3. Minimal cholelithiasis. 4. Colonic diverticulosis without inflammatory changes to indicate diverticulitis. Electronically Signed   By: Yvonne Kendall M.D.   On:  09/27/2022 17:27   DG Chest Portable 1 View  Result Date: 09/27/2022 CLINICAL DATA:  Bladder spasms.  Urinary retention. EXAM: PORTABLE CHEST 1 VIEW COMPARISON:  Chest two views 05/05/2015 FINDINGS: Cardiac silhouette is again moderately enlarged, noting magnification from AP technique. The thoracic aorta is again mildly tortuous. Mild-to-moderate calcifications within the aortic arch. Mild bibasilar horizontal linear subsegmental atelectasis versus scarring. No definite pleural effusion. No pneumothorax. Moderate multilevel disc space narrowing and bridging osteophytes of the thoracic spine. IMPRESSION: 1. No acute lung process. 2. Moderate cardiomegaly.  Aortic atherosclerosis. Electronically Signed   By: Yvonne Kendall M.D.   On: 09/27/2022 16:59    EKG: None   Assessment/Plan Principal Problem:   Gross hematuria Active Problems:   UTI (urinary tract infection)   Severe sepsis (HCC)   Benign essential HTN   Malignant neoplasm of prostate (HCC)   Assessment and Plan: * Gross hematuria Several episodes of gross hematuria in the past.  Hemoglobin stable.  Vitals stable. On aspirin, not on anticoagulation.  He had cystoscopy October/2022 with Dr. Junious Silk-  moderate hyperplasia and borderline obstruction, small friable appearing prostate and bladder neck vessels.  No active bleeding.  He suspected that the bleeding was from the prostate area from prostate cancer recurrence or radiation prostatitis/cystitis.  Subsequent PET scan showed focal activity in the right seminal vesicle concerning for locally advanced prostate cancer, activity and right central gland the specific also concerning for cancer.  No metastatic disease. -Evaluated by Dr. Felipa Eth in ED, recommended continuous bladder irrigation to keep urine light pink with hand irrigation as needed. -CT abdomen and pelvis -concerning for mild cystitis, Recommend clinical correlation for possible radiation therapy changes in the pelvis, as  this fat stranding may also be the sequela of radiation therapy changes.  Also concerns for metastatic disease with increase in size of bilateral external iliac chain lymph nodes. -Monitor Hgb -Continue oxybutynin  UTI (urinary tract infection) Possible UTI meeting criteria for severe sepsis.  Unable to analyze UA due to urine pigment.  Significant leukocytosis of 24.5.  Tachycardic heart rate 90-120.  Tmax 99.5.  Lactic acidosis of 2.4 > 1.5.  No prior urine cultures on file. -Follow-up urine and blood cultures -Continue ceftriaxone 1 g daily -1 L bolus given, continue LR 100cc/hr x 20hrs  Benign essential HTN Stable. -Hold losartan in the setting of acute blood loss.    DVT prophylaxis: SCDS Code Status:  Full Family Communication: None at bedside Disposition Plan: ~ 2 days Consults called: Urology Admission status: Inpt tele I certify that at the point of admission it is my clinical judgment that the patient will require  inpatient hospital care spanning beyond 2 midnights from the point of admission due to high intensity of service, high risk for further deterioration and high frequency of surveillance required.   Bethena Roys MD [facility]  09/27/2022, 6:20 PM

## 2022-09-27 NOTE — Assessment & Plan Note (Signed)
Stable. -Hold losartan in the setting of acute blood loss.

## 2022-09-28 DIAGNOSIS — R339 Retention of urine, unspecified: Secondary | ICD-10-CM | POA: Diagnosis not present

## 2022-09-28 DIAGNOSIS — Z87448 Personal history of other diseases of urinary system: Secondary | ICD-10-CM | POA: Diagnosis not present

## 2022-09-28 DIAGNOSIS — R310 Gross hematuria: Secondary | ICD-10-CM | POA: Diagnosis not present

## 2022-09-28 DIAGNOSIS — Z8546 Personal history of malignant neoplasm of prostate: Secondary | ICD-10-CM | POA: Diagnosis not present

## 2022-09-28 LAB — CBC
HCT: 35.7 % — ABNORMAL LOW (ref 39.0–52.0)
HCT: 36.8 % — ABNORMAL LOW (ref 39.0–52.0)
Hemoglobin: 11.3 g/dL — ABNORMAL LOW (ref 13.0–17.0)
Hemoglobin: 11.8 g/dL — ABNORMAL LOW (ref 13.0–17.0)
MCH: 27.4 pg (ref 26.0–34.0)
MCH: 27.6 pg (ref 26.0–34.0)
MCHC: 31.7 g/dL (ref 30.0–36.0)
MCHC: 32.1 g/dL (ref 30.0–36.0)
MCV: 86.2 fL (ref 80.0–100.0)
MCV: 86.7 fL (ref 80.0–100.0)
Platelets: 171 10*3/uL (ref 150–400)
Platelets: 179 10*3/uL (ref 150–400)
RBC: 4.12 MIL/uL — ABNORMAL LOW (ref 4.22–5.81)
RBC: 4.27 MIL/uL (ref 4.22–5.81)
RDW: 13.7 % (ref 11.5–15.5)
RDW: 14 % (ref 11.5–15.5)
WBC: 15.8 10*3/uL — ABNORMAL HIGH (ref 4.0–10.5)
WBC: 16.7 10*3/uL — ABNORMAL HIGH (ref 4.0–10.5)
nRBC: 0 % (ref 0.0–0.2)
nRBC: 0 % (ref 0.0–0.2)

## 2022-09-28 LAB — BASIC METABOLIC PANEL
Anion gap: 6 (ref 5–15)
BUN: 10 mg/dL (ref 8–23)
CO2: 24 mmol/L (ref 22–32)
Calcium: 7.7 mg/dL — ABNORMAL LOW (ref 8.9–10.3)
Chloride: 107 mmol/L (ref 98–111)
Creatinine, Ser: 0.85 mg/dL (ref 0.61–1.24)
GFR, Estimated: >60 mL/min (ref >60)
Glucose, Bld: 106 mg/dL — ABNORMAL HIGH (ref 70–99)
Potassium: 4.1 mmol/L (ref 3.5–5.1)
Sodium: 137 mmol/L (ref 135–145)

## 2022-09-28 MED ORDER — PHENAZOPYRIDINE HCL 100 MG PO TABS: 200.0000 mg | ORAL_TABLET | Freq: Three times a day (TID) | ORAL | Status: DC | PRN

## 2022-09-28 NOTE — Progress Notes (Signed)
Urology Inpatient Progress Note  Subjective: Urine clear with minimal CBI, no clots. CT results reviewed. Urine culture pending.  Anti-infectives: Anti-infectives (From admission, onward)    Start     Dose/Rate Route Frequency Ordered Stop   09/28/22 1500  cefTRIAXone (ROCEPHIN) 1 g in sodium chloride 0.9 % 100 mL IVPB        1 g 200 mL/hr over 30 Minutes Intravenous Every 24 hours 09/27/22 2106     09/27/22 1515  cefTRIAXone (ROCEPHIN) 1 g in sodium chloride 0.9 % 100 mL IVPB        1 g 200 mL/hr over 30 Minutes Intravenous  Once 09/27/22 1503 09/27/22 1626       Current Facility-Administered Medications  Medication Dose Route Frequency Provider Last Rate Last Admin   0.9 %  sodium chloride infusion   Intravenous Continuous Emokpae, Ejiroghene E, MD 100 mL/hr at 09/28/22 0852 New Bag at 09/28/22 4097   acetaminophen (TYLENOL) tablet 650 mg  650 mg Oral Q6H PRN Emokpae, Ejiroghene E, MD       Or   acetaminophen (TYLENOL) suppository 650 mg  650 mg Rectal Q6H PRN Emokpae, Ejiroghene E, MD       cefTRIAXone (ROCEPHIN) 1 g in sodium chloride 0.9 % 100 mL IVPB  1 g Intravenous Q24H Emokpae, Ejiroghene E, MD       finasteride (PROSCAR) tablet 5 mg  5 mg Oral Daily Emokpae, Ejiroghene E, MD   5 mg at 09/28/22 1054   gabapentin (NEURONTIN) capsule 300 mg  300 mg Oral TID Emokpae, Ejiroghene E, MD   300 mg at 09/28/22 1054   lidocaine (XYLOCAINE) 2 % jelly 1 Application  1 Application Urethral Once PRN Emokpae, Ejiroghene E, MD       loratadine (CLARITIN) tablet 10 mg  10 mg Oral Daily Emokpae, Ejiroghene E, MD   10 mg at 09/28/22 1055   ondansetron (ZOFRAN) tablet 4 mg  4 mg Oral Q6H PRN Emokpae, Ejiroghene E, MD       Or   ondansetron (ZOFRAN) injection 4 mg  4 mg Intravenous Q6H PRN Emokpae, Ejiroghene E, MD       oxybutynin (DITROPAN) tablet 5 mg  5 mg Oral Q8H PRN Amiera Herzberg, Reece Leader., MD   5 mg at 09/27/22 2159   oxyCODONE-acetaminophen (PERCOCET/ROXICET) 5-325 MG per tablet 2  tablet  2 tablet Oral QID PRN Denton Brick, Ejiroghene E, MD   2 tablet at 09/27/22 2208   pantoprazole (PROTONIX) EC tablet 40 mg  40 mg Oral Daily Emokpae, Ejiroghene E, MD   40 mg at 09/28/22 1054   phenazopyridine (PYRIDIUM) tablet 200 mg  200 mg Oral TID PRN Rita Prom, Reece Leader., MD       polyethylene glycol (MIRALAX / GLYCOLAX) packet 17 g  17 g Oral Daily PRN Emokpae, Ejiroghene E, MD       tamsulosin (FLOMAX) capsule 0.4 mg  0.4 mg Oral Daily Emokpae, Ejiroghene E, MD   0.4 mg at 09/28/22 1055     Objective: Vital signs in last 24 hours: Temp:  [97.9 F (36.6 C)-99.5 F (37.5 C)] 98.5 F (36.9 C) (10/04 0458) Pulse Rate:  [61-120] 70 (10/04 0822) Resp:  [11-25] 18 (10/04 0822) BP: (108-167)/(65-110) 128/73 (10/04 0822) SpO2:  [92 %-98 %] 97 % (10/04 0822) Weight:  [106.6 kg] 106.6 kg (10/03 1127)  Intake/Output from previous day: 10/03 0701 - 10/04 0700 In: 7216.2 [I.V.:416.2; IV Piggyback:1100] Out: 7800 [Urine:7800] Intake/Output this shift: Total I/O In: 240 [P.O.:240]  Out: -  GENERAL APPEARANCE:  Well appearing, well developed, well nourished, NAD ABDOMEN:  Soft, non-tender, no masses EXTREMITIES:  Moves all extremities well, without clubbing, cyanosis, or edema NEUROLOGIC:  Alert and oriented x 3, CN II-XII grossly intact MENTAL STATUS:  appropriate SKIN:  Warm, dry, and intact GU:  foley draining clear urine   Lab Results:  Recent Labs    09/28/22 0009 09/28/22 0620  WBC 16.7* 15.8*  HGB 11.8* 11.3*  HCT 36.8* 35.7*  PLT 179 171   BMET Recent Labs    09/27/22 1159 09/28/22 0009  NA 137 137  K 4.3 4.1  CL 107 107  CO2 19* 24  GLUCOSE 141* 106*  BUN 12 10  CREATININE 1.06 0.85  CALCIUM 8.8* 7.7*   PT/INR No results for input(s): "LABPROT", "INR" in the last 72 hours. ABG No results for input(s): "PHART", "HCO3" in the last 72 hours.  Invalid input(s): "PCO2", "PO2"  Studies/Results: CT ABDOMEN PELVIS W CONTRAST  Result Date:  09/27/2022 CLINICAL DATA:  Gross microscopic hematuria. Urinary retention. History of prostate cancer. EXAM: CT ABDOMEN AND PELVIS WITH CONTRAST TECHNIQUE: Multidetector CT imaging of the abdomen and pelvis was performed using the standard protocol following bolus administration of intravenous contrast. RADIATION DOSE REDUCTION: This exam was performed according to the departmental dose-optimization program which includes automated exposure control, adjustment of the mA and/or kV according to patient size and/or use of iterative reconstruction technique. CONTRAST:  164m OMNIPAQUE IOHEXOL 300 MG/ML  SOLN COMPARISON:  CT abdomen and pelvis without contrast 09/26/2021; PET-CT 11/10/2021 FINDINGS: Lower chest: There is bilateral posterior lower lobe ground-glass subsegmental atelectasis. A 3 mm left lower lobe pulmonary nodule is unchanged from 09/2021 and 05/12/2021 prior CTs. This is likely benign and no follow-up imaging is recommended. Hepatobiliary: Smooth liver contours. No focal liver lesion is seen. Tiny gallstones are again visualized. No inflammatory father while changes are seen. No intrahepatic or extrahepatic biliary ductal dilatation. Pancreas: Unremarkable. No pancreatic ductal dilatation or surrounding inflammatory changes. Spleen: A small splenule is again seen inferolateral to the spleen. The spleen enhances homogeneously and is normal in size. Adrenals/Urinary Tract: Normal adrenals. The kidneys enhance uniformly and are symmetric in size without hydronephrosis. There is again a fluid density left mid to upper pole lateral cyst measuring up to 6.8 cm. No follow-up imaging is recommended. The bilateral ureters are normal in caliber. The urinary bladder is decompressed by indwelling Foley catheter. Mild fat stranding anterior to the urinary bladder appears new from prior 09/27/2021 noncontrast CT and 11/10/2021 noncontrast PET-CT. This may be secondary to mild cystitis. Recommend clinical correlation  for possible radiation therapy in the pelvis, as this fat stranding may be the sequela of radiation therapy changes. On delayed phase images, contrast opacifies the bilateral renal collecting systems and the proximal aspect of the bilateral ureters without a filling defects seen. Stomach/Bowel: Mild-to-moderate sigmoid and mild descending colon diverticulosis without inflammatory changes to indicate diverticulitis, unchanged from prior. The terminal ileum is unremarkable. Normal caliber of the appendix without surrounding inflammatory fat stranding. At least 3 appendicoliths, measuring up to 2 mm (axial series 2, image 77) appear new from prior. Redemonstration of small sliding hiatal hernia. No dilated loops of bowel to indicate bowel obstruction. Vascular/Lymphatic: No abdominal aortic aneurysm. Mild-to-moderate vascular calcifications. The major intra-abdominal aortic branch vessels appear patent. A left external iliac 1.8 x 1.7 cm lymph node (axial series 2, image 83) is mildly increased from 1.3 x 1.2 cm on 09/26/2021 and measured  in a similar manner. Enlarged right external iliac chain lymph nodes are again seen, overall increased in size from prior. Reference 2.1 x 2.0 cm right external iliac lymph node (axial series 2, image 82) is increased from 1.3 x 1.7 cm previously when measured in a similar manner. Multiple more proximal right external iliac chain lymph nodes are also increased in size compared to prior. Reproductive: The prostate is surgically absent. Prostate radiotherapy seeds are again seen. The seminal vesicles are present and appear unchanged. Other: Upper abdominal pain broad-mouthed, shallow midline ventral fat containing hernia with hernia orifice measuring up to approximately 6 cm x 7 cm (transverse by AP) and with the ventral herniated mesenteric fat measuring only 1 cm (axial series 2, image 44), unchanged from prior. Mild right-greater-than-left fat containing inguinal hernias are  unchanged. No free air or free fluid is seen within the abdomen or pelvis. Musculoskeletal: Moderate anterior T11 vertebral body height loss and moderate superior left-greater-than-right L2 endplate degenerative Schmorl's nodes are unchanged. No aggressive lytic or blastic osseous lesion is seen. IMPRESSION: Compared to 09/26/2021: 1. The urinary bladder is decompressed by indwelling Foley catheter. There is new mild fat stranding anterior to the urinary bladder. This may be secondary to mild cystitis. Recommend clinical correlation for possible radiation therapy changes in the pelvis, as this fat stranding may also be the sequela of radiation therapy changes. 2. Compared to 09/26/2021 and 11/10/2021 noncontrast CT, there is interval increase in size of bilateral external iliac chain lymphadenopathy. This is again concerning for metastatic disease. 3. Minimal cholelithiasis. 4. Colonic diverticulosis without inflammatory changes to indicate diverticulitis. Electronically Signed   By: Yvonne Kendall M.D.   On: 09/27/2022 17:27   DG Chest Portable 1 View  Result Date: 09/27/2022 CLINICAL DATA:  Bladder spasms.  Urinary retention. EXAM: PORTABLE CHEST 1 VIEW COMPARISON:  Chest two views 05/05/2015 FINDINGS: Cardiac silhouette is again moderately enlarged, noting magnification from AP technique. The thoracic aorta is again mildly tortuous. Mild-to-moderate calcifications within the aortic arch. Mild bibasilar horizontal linear subsegmental atelectasis versus scarring. No definite pleural effusion. No pneumothorax. Moderate multilevel disc space narrowing and bridging osteophytes of the thoracic spine. IMPRESSION: 1. No acute lung process. 2. Moderate cardiomegaly.  Aortic atherosclerosis. Electronically Signed   By: Yvonne Kendall M.D.   On: 09/27/2022 16:59     Assessment & Plan: Gross hematuria with clot retention History of prostate cancer s/p radiation tx History of radiation cystitis Possible UTI  D/C  CBI since urine is clear Continue antibiotics for possible UTI, culture pending Continue foley until tomorrow AM - if urine remains clear, will remove foley    Michaelle Birks, MD 09/28/2022

## 2022-09-28 NOTE — Progress Notes (Signed)
  Transition of Care [facility]) Screening Note   Patient Details  Name: Mason Aguilar Date of Birth: 25-Jul-1946   Transition of [facility]) CM/SW Contact:    Iona Beard, [place] Phone Number: 09/28/2022, 11:21 AM    Transition of Care Department [facility]) has reviewed patient and no TOC needs have been identified at this time. We will continue to monitor patient advancement through interdisciplinary progression rounds. If new patient transition needs arise, please place a TOC consult.

## 2022-09-28 NOTE — Progress Notes (Signed)
PROGRESS NOTE     Mason Aguilar, is a 76 y.o. male, DOB - 05/16/1946, EUM:[id number]  Admit date - 09/27/2022   Admitting Physician Bethena Roys, MD  Outpatient Primary MD for the patient is [name], MD  LOS - 1  Chief Complaint  Patient presents with   Urinary Retention        Brief Narrative:   76 y.o. male with medical history significant for prostate cancer, hypertension admitted on 09/27/2022 with gross hematuria    -Assessment and Plan: 1)Gross hematuria Discussed with urologist Dr. Felipa Eth -Patient previously had cystoscopy October/2022 with Dr. Junious Silk-  moderate hyperplasia and borderline obstruction, small friable appearing prostate and bladder neck vessels.  No active bleeding.  He suspected that the bleeding was from the prostate area from prostate cancer recurrence or radiation prostatitis/cystitis.  Subsequent PET scan showed focal activity in the right seminal vesicle concerning for locally advanced prostate cancer, activity and right central gland the specific also concerning for cancer.  No metastatic disease. -Improved with CBI -Plan is to stop CBI today -Keep Foley in -Stop oxybutynin -Possible discharge home on 09/29/2022 if no further hematuria after stopping CBI   2) possible UTI (urinary tract infection)-in the setting of gross hematuria --CT abdomen and pelvis -concerning for mild cystitis,  -Urine culture pending -Continue ceftriaxone 1 g daily  3)Benign essential HTN Stable. Continue to hold losartan  4)Urinary retention--- continue Foley, continue Flomax   Disposition/Need for in-Hospital Stay- patient unable to be discharged at this time due to gross hematuria requiring CBI and further investigations  Status is: Inpatient   Disposition: The patient is from: Home              Anticipated d/c is to: Home              Anticipated d/c date is: 1 day              Patient currently is not medically stable to d/c. Barriers: Not  Clinically Stable-   Code Status :  -  Code Status: Full Code   Family Communication:    NA (patient is alert, awake and coherent)   DVT Prophylaxis  :   - SCDs   SCDs Start: 09/27/22 2107   Lab Results  Component Value Date   PLT 171 09/28/2022    Inpatient Medications  Scheduled Meds:  finasteride  5 mg Oral Daily   gabapentin  300 mg Oral TID   loratadine  10 mg Oral Daily   pantoprazole  40 mg Oral Daily   tamsulosin  0.4 mg Oral Daily   Continuous Infusions:  sodium chloride 100 mL/hr at 09/28/22 0852   cefTRIAXone (ROCEPHIN)  IV 1 g (09/28/22 1617)   PRN Meds:.acetaminophen **OR** acetaminophen, lidocaine, ondansetron **OR** ondansetron (ZOFRAN) IV, oxybutynin, oxyCODONE-acetaminophen, phenazopyridine, polyethylene glycol   Anti-infectives (From admission, onward)    Start     Dose/Rate Route Frequency Ordered Stop   09/28/22 1500  cefTRIAXone (ROCEPHIN) 1 g in sodium chloride 0.9 % 100 mL IVPB        1 g 200 mL/hr over 30 Minutes Intravenous Every 24 hours 09/27/22 2106     09/27/22 1515  cefTRIAXone (ROCEPHIN) 1 g in sodium chloride 0.9 % 100 mL IVPB        1 g 200 mL/hr over 30 Minutes Intravenous  Once 09/27/22 1503 09/27/22 1626         Subjective: Gloriann Loan today has no fevers, no emesis,  No  chest pain,   - Urine becoming less bloody with CBI -No abdominal pain   Objective: Vitals:   09/28/22 0022 09/28/22 0458 09/28/22 0822 09/28/22 1500  BP: 122/65 109/72 128/73 114/77  Pulse: 80 61 70   Resp: '18 16 18 16  '$ Temp: 98.3 F (36.8 C) 98.5 F (36.9 C)  98.3 F (36.8 C)  TempSrc: Oral Oral  Oral  SpO2: 97% 97% 97% 98%  Weight:      Height:        Intake/Output Summary (Last 24 hours) at 09/28/2022 1623 Last data filed at 09/28/2022 1300 Gross per 24 hour  Intake 6696.15 ml  Output 7800 ml  Net -1103.85 ml   Filed Weights   09/27/22 1127  Weight: 106.6 kg   Physical Exam  Gen:- Awake Alert,  in no apparent distress  HEENT:-  [place].AT, No sclera icterus Neck-Supple Neck,No JVD,.  Lungs-  CTAB , fair symmetrical air movement CV- S1, S2 normal, regular  Abd-  +ve B.Sounds, Abd Soft, No tenderness,    Extremity/Skin:- No  edema, pedal pulses present  Psych-affect is appropriate, oriented x3 Neuro-generalized weakness, no new focal deficits, no tremors GU-three-way Foley with CBI  Data Reviewed: I have personally reviewed following labs and imaging studies  CBC: Recent Labs  Lab 09/27/22 1159 09/27/22 1713 09/28/22 0009 09/28/22 0620  WBC 24.5* 21.0* 16.7* 15.8*  HGB 14.6 12.4* 11.8* 11.3*  HCT 44.7 38.6* 36.8* 35.7*  MCV 85.6 86.2 86.2 86.7  PLT 247 202 179 902   Basic Metabolic Panel: Recent Labs  Lab 09/27/22 1159 09/28/22 0009  NA 137 137  K 4.3 4.1  CL 107 107  CO2 19* 24  GLUCOSE 141* 106*  BUN 12 10  CREATININE 1.06 0.85  CALCIUM 8.8* 7.7*    Recent Results (from the past 240 hour(s))  Culture, blood (routine x 2)     Status: None (Preliminary result)   Collection Time: 09/27/22  1:16 PM   Specimen: BLOOD  Result Value Ref Range Status   Specimen Description BLOOD LEFT HAND  Final   Special Requests   Final    BOTTLES DRAWN AEROBIC AND ANAEROBIC Blood Culture adequate volume   Culture   Final    NO GROWTH < 24 HOURS Performed at [facility], [address], [place], [place] [address]    Report Status PENDING  Incomplete  Culture, blood (routine x 2)     Status: None (Preliminary result)   Collection Time: 09/27/22  1:16 PM   Specimen: BLOOD  Result Value Ref Range Status   Specimen Description BLOOD RIGHT HAND  Final   Special Requests   Final    Blood Culture results may not be optimal due to an excessive volume of blood received in culture bottles BOTTLES DRAWN AEROBIC AND ANAEROBIC   Culture   Final    NO GROWTH < 24 HOURS Performed at [facility], [address] [place], [place] [address]    Report Status PENDING  Incomplete      Radiology Studies: CT ABDOMEN  PELVIS W CONTRAST  Result Date: 09/27/2022 CLINICAL DATA:  Gross microscopic hematuria. Urinary retention. History of prostate cancer. EXAM: CT ABDOMEN AND PELVIS WITH CONTRAST TECHNIQUE: Multidetector CT imaging of the abdomen and pelvis was performed using the standard protocol following bolus administration of intravenous contrast. RADIATION DOSE REDUCTION: This exam was performed according to the departmental dose-optimization program which includes automated exposure control, adjustment of the mA and/or kV according to patient size  and/or use of iterative reconstruction technique. CONTRAST:  153m OMNIPAQUE IOHEXOL 300 MG/ML  SOLN COMPARISON:  CT abdomen and pelvis without contrast 09/26/2021; PET-CT 11/10/2021 FINDINGS: Lower chest: There is bilateral posterior lower lobe ground-glass subsegmental atelectasis. A 3 mm left lower lobe pulmonary nodule is unchanged from 09/2021 and 05/12/2021 prior CTs. This is likely benign and no follow-up imaging is recommended. Hepatobiliary: Smooth liver contours. No focal liver lesion is seen. Tiny gallstones are again visualized. No inflammatory father while changes are seen. No intrahepatic or extrahepatic biliary ductal dilatation. Pancreas: Unremarkable. No pancreatic ductal dilatation or surrounding inflammatory changes. Spleen: A small splenule is again seen inferolateral to the spleen. The spleen enhances homogeneously and is normal in size. Adrenals/Urinary Tract: Normal adrenals. The kidneys enhance uniformly and are symmetric in size without hydronephrosis. There is again a fluid density left mid to upper pole lateral cyst measuring up to 6.8 cm. No follow-up imaging is recommended. The bilateral ureters are normal in caliber. The urinary bladder is decompressed by indwelling Foley catheter. Mild fat stranding anterior to the urinary bladder appears new from prior 09/27/2021 noncontrast CT and 11/10/2021 noncontrast PET-CT. This may be secondary to mild  cystitis. Recommend clinical correlation for possible radiation therapy in the pelvis, as this fat stranding may be the sequela of radiation therapy changes. On delayed phase images, contrast opacifies the bilateral renal collecting systems and the proximal aspect of the bilateral ureters without a filling defects seen. Stomach/Bowel: Mild-to-moderate sigmoid and mild descending colon diverticulosis without inflammatory changes to indicate diverticulitis, unchanged from prior. The terminal ileum is unremarkable. Normal caliber of the appendix without surrounding inflammatory fat stranding. At least 3 appendicoliths, measuring up to 2 mm (axial series 2, image 77) appear new from prior. Redemonstration of small sliding hiatal hernia. No dilated loops of bowel to indicate bowel obstruction. Vascular/Lymphatic: No abdominal aortic aneurysm. Mild-to-moderate vascular calcifications. The major intra-abdominal aortic branch vessels appear patent. A left external iliac 1.8 x 1.7 cm lymph node (axial series 2, image 83) is mildly increased from 1.3 x 1.2 cm on 09/26/2021 and measured in a similar manner. Enlarged right external iliac chain lymph nodes are again seen, overall increased in size from prior. Reference 2.1 x 2.0 cm right external iliac lymph node (axial series 2, image 82) is increased from 1.3 x 1.7 cm previously when measured in a similar manner. Multiple more proximal right external iliac chain lymph nodes are also increased in size compared to prior. Reproductive: The prostate is surgically absent. Prostate radiotherapy seeds are again seen. The seminal vesicles are present and appear unchanged. Other: Upper abdominal pain broad-mouthed, shallow midline ventral fat containing hernia with hernia orifice measuring up to approximately 6 cm x 7 cm (transverse by AP) and with the ventral herniated mesenteric fat measuring only 1 cm (axial series 2, image 44), unchanged from prior. Mild right-greater-than-left fat  containing inguinal hernias are unchanged. No free air or free fluid is seen within the abdomen or pelvis. Musculoskeletal: Moderate anterior T11 vertebral body height loss and moderate superior left-greater-than-right L2 endplate degenerative Schmorl's nodes are unchanged. No aggressive lytic or blastic osseous lesion is seen. IMPRESSION: Compared to 09/26/2021: 1. The urinary bladder is decompressed by indwelling Foley catheter. There is new mild fat stranding anterior to the urinary bladder. This may be secondary to mild cystitis. Recommend clinical correlation for possible radiation therapy changes in the pelvis, as this fat stranding may also be the sequela of radiation therapy changes. 2. Compared to 09/26/2021 and  11/10/2021 noncontrast CT, there is interval increase in size of bilateral external iliac chain lymphadenopathy. This is again concerning for metastatic disease. 3. Minimal cholelithiasis. 4. Colonic diverticulosis without inflammatory changes to indicate diverticulitis. Electronically Signed   By: Yvonne Kendall M.D.   On: 09/27/2022 17:27   DG Chest Portable 1 View  Result Date: 09/27/2022 CLINICAL DATA:  Bladder spasms.  Urinary retention. EXAM: PORTABLE CHEST 1 VIEW COMPARISON:  Chest two views 05/05/2015 FINDINGS: Cardiac silhouette is again moderately enlarged, noting magnification from AP technique. The thoracic aorta is again mildly tortuous. Mild-to-moderate calcifications within the aortic arch. Mild bibasilar horizontal linear subsegmental atelectasis versus scarring. No definite pleural effusion. No pneumothorax. Moderate multilevel disc space narrowing and bridging osteophytes of the thoracic spine. IMPRESSION: 1. No acute lung process. 2. Moderate cardiomegaly.  Aortic atherosclerosis. Electronically Signed   By: Yvonne Kendall M.D.   On: 09/27/2022 16:59     Scheduled Meds:  finasteride  5 mg Oral Daily   gabapentin  300 mg Oral TID   loratadine  10 mg Oral Daily    pantoprazole  40 mg Oral Daily   tamsulosin  0.4 mg Oral Daily   Continuous Infusions:  sodium chloride 100 mL/hr at 09/28/22 0852   cefTRIAXone (ROCEPHIN)  IV 1 g (09/28/22 1617)     LOS: 1 day    Roxan Hockey M.D on 09/28/2022 at 4:23 PM  Go to [email or web address] - for contact info  [facility] - Office  [phone]  If 7PM-7AM, please contact night-coverage [email or web address] 09/28/2022, 4:23 PM

## 2022-09-29 DIAGNOSIS — R339 Retention of urine, unspecified: Secondary | ICD-10-CM | POA: Diagnosis not present

## 2022-09-29 DIAGNOSIS — Z8546 Personal history of malignant neoplasm of prostate: Secondary | ICD-10-CM | POA: Diagnosis not present

## 2022-09-29 DIAGNOSIS — R310 Gross hematuria: Secondary | ICD-10-CM | POA: Diagnosis not present

## 2022-09-29 DIAGNOSIS — Z87448 Personal history of other diseases of urinary system: Secondary | ICD-10-CM | POA: Diagnosis not present

## 2022-09-29 LAB — CBC
HCT: 36.9 % — ABNORMAL LOW (ref 39.0–52.0)
Hemoglobin: 11.5 g/dL — ABNORMAL LOW (ref 13.0–17.0)
MCH: 27.8 pg (ref 26.0–34.0)
MCHC: 31.2 g/dL (ref 30.0–36.0)
MCV: 89.3 fL (ref 80.0–100.0)
Platelets: 134 10*3/uL — ABNORMAL LOW (ref 150–400)
RBC: 4.13 MIL/uL — ABNORMAL LOW (ref 4.22–5.81)
RDW: 13.8 % (ref 11.5–15.5)
WBC: 13.6 10*3/uL — ABNORMAL HIGH (ref 4.0–10.5)
nRBC: 0 % (ref 0.0–0.2)

## 2022-09-29 LAB — URINE CULTURE: Culture: NO GROWTH

## 2022-09-29 MED ORDER — ASPIRIN EC 81 MG PO TBEC: 81.0000 mg | DELAYED_RELEASE_TABLET | Freq: Every day | ORAL | 11 refills | Status: AC

## 2022-09-29 MED ORDER — POLYETHYLENE GLYCOL 3350 17 G PO PACK: 17.0000 g | PACK | Freq: Every day | ORAL | 4 refills | Status: AC

## 2022-09-29 MED ORDER — CHLORHEXIDINE GLUCONATE CLOTH 2 % EX PADS
6.0000 | MEDICATED_PAD | Freq: Every day | CUTANEOUS | Status: DC
  Administered 2022-09-29: 6 via TOPICAL

## 2022-09-29 MED ORDER — TAMSULOSIN HCL 0.4 MG PO CAPS: 0.4000 mg | ORAL_CAPSULE | Freq: Every day | ORAL | 3 refills | Status: DC

## 2022-09-29 NOTE — Progress Notes (Signed)
Pt tolerated catheter removal well, 30 cc removed from balloon, catheter came out intact and pt had no pain, slight clotting noted after removal from the penis. Peri care preformed. Pt resting comfortable in bed, will monitor output since removal.

## 2022-09-29 NOTE — Discharge Instructions (Signed)
1)follow up with urologist Dr. Junious Silk as previously advised 2) follow-up with PCP for repeat CBC blood work within 1 week 3)Please call or return if any blood in your urine or other concerns

## 2022-09-29 NOTE — Discharge Summary (Signed)
Mason Aguilar, is a 76 y.o. male  DOB 1946/12/02  MRN [id number].  Admission date:  09/27/2022  Admitting Physician  Bethena Roys, MD  Discharge Date:  09/29/2022   Primary MD  [name], MD  Recommendations for primary care physician for things to follow:   1)follow up with urologist Dr. Junious Silk as previously advised 2) follow-up with PCP for repeat CBC blood work within 1 week 3)Please call or return if any blood in your urine or other concerns  Admission Diagnosis  Urinary retention [R33.9] Gross hematuria [R31.0] Acute cystitis with hematuria [N30.01]   Discharge Diagnosis  Urinary retention [R33.9] Gross hematuria [R31.0] Acute cystitis with hematuria [N30.01]    Principal Problem:   Gross hematuria Active Problems:   UTI (urinary tract infection)   Severe sepsis (HCC)   Benign essential HTN   Malignant neoplasm of prostate ([place])   History of prostate cancer      Past Medical History:  Diagnosis Date   HOH (hard of hearing)    Hypercholesteremia    Hypertension    Prostate cancer [facility])     Past Surgical History:  Procedure Laterality Date   arm surgery     OPEN REDUCTION INTERNAL FIXATION (ORIF) METACARPAL Right 06/03/2021   Procedure: OPEN REDUCTION INTERNAL FIXATION (ORIF)RIGHT FIFTH  METACARPAL;  Surgeon: Daryll Brod, MD;  Location: ;  Service: Orthopedics;  Laterality: Right;  AXILLARY BLOCK   polyps       HPI  from the history and physical done on the day of admission:     Chief Complaint: Blood in urine   HPI: Mason Aguilar is a 76 y.o. male with medical history significant for prostate cancer, hypertension. Patient presented to the ED with complaints of inability to urinate, pain with urination and passing of blood in urine.  He also reports lower abdominal pain.   He has had several episodes of hematuria in the past.  He had cystoscopy  October/2022 with Dr. Junious Silk, findings showed moderate hyperplasia and borderline obstruction, small friable appearing prostate and bladder neck vessels.  No active bleeding.  He suspected that the bleeding was from the prostate area from prostate cancer recurrence or radiation prostatitis/cystitis.  Subsequent PET scan showed focal activity in the right seminal vesicle concerning for locally advanced prostate cancer, activity and right central gland the specific also concerning for cancer.  No metastatic disease.   He takes aspirin, but is not on anticoagulation.   ED Course: Tmax 99.5.  Heart rate 93-120.  Respirate rate 11-25.  Blood pressure systolic 413-244.  O2 sats greater than 92% on room air. Leukocytosis of 24.5.  Hemoglobin stable at 14.6.  Lactic acidosis 2.4 > 1.5. CT abdomen and pelvis ordered pending. 1 L bolus given.  IV ceftriaxone started.    Three-way Foley catheter placed then bladder irrigation started return of multiple clots Patient seen in ED at bedside by urologist Dr. Felipa Eth.     Hospital Course:   Assessment and Plan: 1)Gross hematuria Discussed with  urologist Dr. Felipa Eth -Patient previously had cystoscopy October/2022 with Dr. Junious Silk-  moderate hyperplasia and borderline obstruction, small friable appearing prostate and bladder neck vessels.  No active bleeding.  He suspected that the bleeding was from the prostate area from prostate cancer recurrence or radiation prostatitis/cystitis.  Subsequent PET scan showed focal activity in the right seminal vesicle concerning for locally advanced prostate cancer, activity and right central gland the specific also concerning for cancer.  No metastatic disease. -Hematuria resolved with CBI -Foley removed and patient is voiding well without further hematuria -Continue Flomax and and follow-up with Dr. Junious Silk as advised in 3 to 4 weeks --Initially treated with Rocephin for possible UTI urine culture negative no  further antibiotics needed   2)HTN--continue losartan   3)Urinary retention--- resolved, voiding well after Foley removal, continue Flomax and Avodart and follow-up with Dr. Junious Silk in 3 to 4 weeks  4) chronic pain--- continue oxycodone and gabapentin this may be contributing to urinary retention  5)HLD--continue Zocor    Disposition: The patient is from: Home              Anticipated d/c is to: Home                Discharge Condition: stable  Follow UP   Follow-up Information     Festus Aloe, MD. Call in 3 week(s).   Specialty: Urology Contact information: [address] [place] [address] [phone]                 Consults obtained -urology  Diet and Activity recommendation:  As advised  Discharge Instructions    Discharge Instructions     Call MD for:  difficulty breathing, headache or visual disturbances   Complete by: As directed    Call MD for:  persistant dizziness or light-headedness   Complete by: As directed    Call MD for:  persistant nausea and vomiting   Complete by: As directed    Call MD for:  severe uncontrolled pain   Complete by: As directed    Call MD for:  temperature >100.4   Complete by: As directed    Diet - low sodium heart healthy   Complete by: As directed    Discharge instructions   Complete by: As directed    1)follow up with urologist Dr. Junious Silk as previously advised 2) follow-up with PCP for repeat CBC blood work within 1 week 3)Please call or return if any blood in your urine or other concerns   Increase activity slowly   Complete by: As directed        Discharge Medications     Allergies as of 09/29/2022   No Known Allergies      Medication List     STOP taking these medications    Ozempic (0.25 or 0.5 MG/DOSE) 2 MG/3ML Sopn Generic drug: Semaglutide(0.25 or 0.'5MG'$ /DOS)   phentermine 37.5 MG tablet Commonly known as: ADIPEX-P       TAKE these medications    alendronate  70 MG tablet Commonly known as: FOSAMAX Take 70 mg by mouth once a week.   aspirin EC 81 MG tablet Take 1 tablet (81 mg total) by mouth daily with breakfast. Swallow whole. What changed: when to take this   cetirizine 10 MG tablet Commonly known as: ZYRTEC Take 10 mg by mouth daily.   finasteride 5 MG tablet Commonly known as: PROSCAR Take 1 tablet (5 mg total) by mouth daily.   gabapentin 300 MG  capsule Commonly known as: NEURONTIN Take 1 capsule by mouth 3 (three) times daily.   losartan 50 MG tablet Commonly known as: COZAAR Take 1 tablet by mouth daily.   omeprazole 40 MG capsule Commonly known as: PRILOSEC Take 1 capsule by mouth daily.   oxyCODONE-acetaminophen 10-325 MG tablet Commonly known as: PERCOCET Take 1 tablet by mouth 4 (four) times daily as needed for pain.   polyethylene glycol 17 g packet Commonly known as: MIRALAX / GLYCOLAX Take 17 g by mouth daily.   simvastatin 40 MG tablet Commonly known as: ZOCOR Take 40 mg by mouth at bedtime.   tamsulosin 0.4 MG Caps capsule Commonly known as: FLOMAX Take 1 capsule (0.4 mg total) by mouth daily.   Vitamin D-3 25 MCG (1000 UT) Caps Take 1 capsule by mouth daily.        Major procedures and Radiology Reports - PLEASE review detailed and final reports for all details, in brief -   CT ABDOMEN PELVIS W CONTRAST  Result Date: 09/27/2022 CLINICAL DATA:  Gross microscopic hematuria. Urinary retention. History of prostate cancer. EXAM: CT ABDOMEN AND PELVIS WITH CONTRAST TECHNIQUE: Multidetector CT imaging of the abdomen and pelvis was performed using the standard protocol following bolus administration of intravenous contrast. RADIATION DOSE REDUCTION: This exam was performed according to the departmental dose-optimization program which includes automated exposure control, adjustment of the mA and/or kV according to patient size and/or use of iterative reconstruction technique. CONTRAST:  136m OMNIPAQUE  IOHEXOL 300 MG/ML  SOLN COMPARISON:  CT abdomen and pelvis without contrast 09/26/2021; PET-CT 11/10/2021 FINDINGS: Lower chest: There is bilateral posterior lower lobe ground-glass subsegmental atelectasis. A 3 mm left lower lobe pulmonary nodule is unchanged from 09/2021 and 05/12/2021 prior CTs. This is likely benign and no follow-up imaging is recommended. Hepatobiliary: Smooth liver contours. No focal liver lesion is seen. Tiny gallstones are again visualized. No inflammatory father while changes are seen. No intrahepatic or extrahepatic biliary ductal dilatation. Pancreas: Unremarkable. No pancreatic ductal dilatation or surrounding inflammatory changes. Spleen: A small splenule is again seen inferolateral to the spleen. The spleen enhances homogeneously and is normal in size. Adrenals/Urinary Tract: Normal adrenals. The kidneys enhance uniformly and are symmetric in size without hydronephrosis. There is again a fluid density left mid to upper pole lateral cyst measuring up to 6.8 cm. No follow-up imaging is recommended. The bilateral ureters are normal in caliber. The urinary bladder is decompressed by indwelling Foley catheter. Mild fat stranding anterior to the urinary bladder appears new from prior 09/27/2021 noncontrast CT and 11/10/2021 noncontrast PET-CT. This may be secondary to mild cystitis. Recommend clinical correlation for possible radiation therapy in the pelvis, as this fat stranding may be the sequela of radiation therapy changes. On delayed phase images, contrast opacifies the bilateral renal collecting systems and the proximal aspect of the bilateral ureters without a filling defects seen. Stomach/Bowel: Mild-to-moderate sigmoid and mild descending colon diverticulosis without inflammatory changes to indicate diverticulitis, unchanged from prior. The terminal ileum is unremarkable. Normal caliber of the appendix without surrounding inflammatory fat stranding. At least 3 appendicoliths,  measuring up to 2 mm (axial series 2, image 77) appear new from prior. Redemonstration of small sliding hiatal hernia. No dilated loops of bowel to indicate bowel obstruction. Vascular/Lymphatic: No abdominal aortic aneurysm. Mild-to-moderate vascular calcifications. The major intra-abdominal aortic branch vessels appear patent. A left external iliac 1.8 x 1.7 cm lymph node (axial series 2, image 83) is mildly increased from 1.3 x 1.2 cm on  09/26/2021 and measured in a similar manner. Enlarged right external iliac chain lymph nodes are again seen, overall increased in size from prior. Reference 2.1 x 2.0 cm right external iliac lymph node (axial series 2, image 82) is increased from 1.3 x 1.7 cm previously when measured in a similar manner. Multiple more proximal right external iliac chain lymph nodes are also increased in size compared to prior. Reproductive: The prostate is surgically absent. Prostate radiotherapy seeds are again seen. The seminal vesicles are present and appear unchanged. Other: Upper abdominal pain broad-mouthed, shallow midline ventral fat containing hernia with hernia orifice measuring up to approximately 6 cm x 7 cm (transverse by AP) and with the ventral herniated mesenteric fat measuring only 1 cm (axial series 2, image 44), unchanged from prior. Mild right-greater-than-left fat containing inguinal hernias are unchanged. No free air or free fluid is seen within the abdomen or pelvis. Musculoskeletal: Moderate anterior T11 vertebral body height loss and moderate superior left-greater-than-right L2 endplate degenerative Schmorl's nodes are unchanged. No aggressive lytic or blastic osseous lesion is seen. IMPRESSION: Compared to 09/26/2021: 1. The urinary bladder is decompressed by indwelling Foley catheter. There is new mild fat stranding anterior to the urinary bladder. This may be secondary to mild cystitis. Recommend clinical correlation for possible radiation therapy changes in the  pelvis, as this fat stranding may also be the sequela of radiation therapy changes. 2. Compared to 09/26/2021 and 11/10/2021 noncontrast CT, there is interval increase in size of bilateral external iliac chain lymphadenopathy. This is again concerning for metastatic disease. 3. Minimal cholelithiasis. 4. Colonic diverticulosis without inflammatory changes to indicate diverticulitis. Electronically Signed   By: Yvonne Kendall M.D.   On: 09/27/2022 17:27   DG Chest Portable 1 View  Result Date: 09/27/2022 CLINICAL DATA:  Bladder spasms.  Urinary retention. EXAM: PORTABLE CHEST 1 VIEW COMPARISON:  Chest two views 05/05/2015 FINDINGS: Cardiac silhouette is again moderately enlarged, noting magnification from AP technique. The thoracic aorta is again mildly tortuous. Mild-to-moderate calcifications within the aortic arch. Mild bibasilar horizontal linear subsegmental atelectasis versus scarring. No definite pleural effusion. No pneumothorax. Moderate multilevel disc space narrowing and bridging osteophytes of the thoracic spine. IMPRESSION: 1. No acute lung process. 2. Moderate cardiomegaly.  Aortic atherosclerosis. Electronically Signed   By: Yvonne Kendall M.D.   On: 09/27/2022 16:59    Micro Results   Recent Results (from the past 240 hour(s))  Urine Culture     Status: None   Collection Time: 09/27/22 11:32 AM   Specimen: Urine, Clean Catch  Result Value Ref Range Status   Specimen Description   Final    URINE, CLEAN CATCH Performed at [facility], [address] [place], [place] [address]    Special Requests   Final    NONE Performed at [facility], [address], [place], [place] [address]    Culture   Final    NO GROWTH Performed at [facility], [place] [address], [place], [place] [address]    Report Status 09/29/2022 FINAL  Final  Culture, blood (routine x 2)     Status: None (Preliminary result)   Collection Time: 09/27/22  1:16 PM   Specimen: BLOOD  Result Value Ref Range Status    Specimen Description BLOOD LEFT HAND  Final   Special Requests   Final    BOTTLES DRAWN AEROBIC AND ANAEROBIC Blood Culture adequate volume   Culture   Final    NO GROWTH 2 DAYS Performed at [facility], [address] [place], [place] [address]    Report Status PENDING  Incomplete  Culture, blood (routine x 2)     Status: None (Preliminary result)   Collection Time: 09/27/22  1:16 PM   Specimen: BLOOD  Result Value Ref Range Status   Specimen Description BLOOD RIGHT HAND  Final   Special Requests   Final    Blood Culture results may not be optimal due to an excessive volume of blood received in culture bottles BOTTLES DRAWN AEROBIC AND ANAEROBIC   Culture   Final    NO GROWTH 2 DAYS Performed at [facility], [address], [place], [place] [address]    Report Status PENDING  Incomplete   Today   Subjective    Mason Aguilar today has no new complaints          Patient has been seen and examined prior to discharge   Objective   Blood pressure 124/64, pulse 74, temperature 98.2 F (36.8 C), temperature source Oral, resp. rate 18, height '5\' 7"'$  (1.702 m), weight 106.6 kg, SpO2 98 %.   Intake/Output Summary (Last 24 hours) at 09/29/2022 1543 Last data filed at 09/29/2022 0800 Gross per 24 hour  Intake 2032.38 ml  Output 3600 ml  Net -1567.62 ml   Exam Gen:- Awake Alert, no acute distress  HEENT:- [place].AT, No sclera icterus Neck-Supple Neck,No JVD,.  Lungs-  CTAB , good air movement bilaterally CV- S1, S2 normal, regular Abd-  +ve B.Sounds, Abd Soft, No tenderness, no CVA area tenderness Extremity/Skin:- No  edema,   good pulses Psych-affect is appropriate, oriented x3 Neuro-no new focal deficits, no tremors  GU-Foley removed and patient is voiding well without hematuria   Data Review   CBC w Diff:  Lab Results  Component Value Date   WBC 13.6 (H) 09/29/2022   HGB 11.5 (L) 09/29/2022   HCT 36.9 (L) 09/29/2022   PLT 134 (L) 09/29/2022   LYMPHOPCT 51  09/26/2021   MONOPCT 4 09/26/2021   EOSPCT 0 09/26/2021   BASOPCT 0 09/26/2021    CMP:  Lab Results  Component Value Date   NA 137 09/28/2022   K 4.1 09/28/2022   CL 107 09/28/2022   CO2 24 09/28/2022   BUN 10 09/28/2022   CREATININE 0.85 09/28/2022  .  Total Discharge time is about 33 minutes  Roxan Hockey M.D on 09/29/2022 at 3:43 PM  Go to [email or web address] -  for contact info  [facility] - Office  [phone]

## 2022-09-29 NOTE — Care Management Important Message (Signed)
Important Message  Patient Details  Name: Mason Aguilar MRN: [id number] Date of Birth: 22-Jul-1946   Medicare Important Message Given:  Yes     [name] 09/29/2022, 11:27 AM

## 2022-09-29 NOTE — Progress Notes (Signed)
Urology Inpatient Progress Note  Subjective: Urine clear today.  No clots or hematuria off CBI since yesterday.  Anti-infectives: Anti-infectives (From admission, onward)    Start     Dose/Rate Route Frequency Ordered Stop   09/28/22 1500  cefTRIAXone (ROCEPHIN) 1 g in sodium chloride 0.9 % 100 mL IVPB        1 g 200 mL/hr over 30 Minutes Intravenous Every 24 hours 09/27/22 2106     09/27/22 1515  cefTRIAXone (ROCEPHIN) 1 g in sodium chloride 0.9 % 100 mL IVPB        1 g 200 mL/hr over 30 Minutes Intravenous  Once 09/27/22 1503 09/27/22 1626       Current Facility-Administered Medications  Medication Dose Route Frequency Provider Last Rate Last Admin   acetaminophen (TYLENOL) tablet 650 mg  650 mg Oral Q6H PRN Emokpae, Ejiroghene E, MD       Or   acetaminophen (TYLENOL) suppository 650 mg  650 mg Rectal Q6H PRN Emokpae, Ejiroghene E, MD       cefTRIAXone (ROCEPHIN) 1 g in sodium chloride 0.9 % 100 mL IVPB  1 g Intravenous Q24H Emokpae, Ejiroghene E, MD 200 mL/hr at 09/28/22 1617 1 g at 09/28/22 1617   Chlorhexidine Gluconate Cloth 2 % PADS 6 each  6 each Topical Daily Emokpae, Courage, MD       finasteride (PROSCAR) tablet 5 mg  5 mg Oral Daily Emokpae, Ejiroghene E, MD   5 mg at 09/28/22 1054   gabapentin (NEURONTIN) capsule 300 mg  300 mg Oral TID Emokpae, Ejiroghene E, MD   300 mg at 09/28/22 2127   lidocaine (XYLOCAINE) 2 % jelly 1 Application  1 Application Urethral Once PRN Emokpae, Ejiroghene E, MD       loratadine (CLARITIN) tablet 10 mg  10 mg Oral Daily Emokpae, Ejiroghene E, MD   10 mg at 09/28/22 1055   ondansetron (ZOFRAN) tablet 4 mg  4 mg Oral Q6H PRN Emokpae, Ejiroghene E, MD       Or   ondansetron (ZOFRAN) injection 4 mg  4 mg Intravenous Q6H PRN Emokpae, Ejiroghene E, MD       oxyCODONE-acetaminophen (PERCOCET/ROXICET) 5-325 MG per tablet 2 tablet  2 tablet Oral QID PRN Emokpae, Ejiroghene E, MD   2 tablet at 09/27/22 2208   pantoprazole (PROTONIX) EC tablet 40 mg   40 mg Oral Daily Emokpae, Ejiroghene E, MD   40 mg at 09/28/22 1054   polyethylene glycol (MIRALAX / GLYCOLAX) packet 17 g  17 g Oral Daily PRN Emokpae, Ejiroghene E, MD       tamsulosin (FLOMAX) capsule 0.4 mg  0.4 mg Oral Daily Emokpae, Ejiroghene E, MD   0.4 mg at 09/28/22 1055     Objective: Vital signs in last 24 hours: Temp:  [98.1 F (36.7 C)-98.4 F (36.9 C)] 98.4 F (36.9 C) (10/05 0412) Pulse Rate:  [65-83] 83 (10/05 0412) Resp:  [15-20] 15 (10/05 0412) BP: (114-134)/(67-77) 134/74 (10/05 0412) SpO2:  [97 %-98 %] 97 % (10/05 0412)  Intake/Output from previous day: 10/04 0701 - 10/05 0700 In: 2272.4 [P.O.:720; I.V.:1452.4; IV Piggyback:100] Out: 3600 [Urine:3600] Intake/Output this shift: No intake/output data recorded.  GENERAL APPEARANCE:  Well appearing, well developed, well nourished, NAD HEENT:  Atraumatic, normocephalic, oropharynx clear ABDOMEN:  Soft, non-tender, no masses EXTREMITIES:  Moves all extremities well, without clubbing, cyanosis, or edema NEUROLOGIC:  Alert and oriented x 3,CN II-XII grossly intact MENTAL STATUS:  appropriate SKIN:  Warm, dry,  and intact GU:  foley draining clear yellow urine  Lab Results:  Recent Labs    09/28/22 0620 09/29/22 0408  WBC 15.8* 13.6*  HGB 11.3* 11.5*  HCT 35.7* 36.9*  PLT 171 134*   BMET Recent Labs    09/27/22 1159 09/28/22 0009  NA 137 137  K 4.3 4.1  CL 107 107  CO2 19* 24  GLUCOSE 141* 106*  BUN 12 10  CREATININE 1.06 0.85  CALCIUM 8.8* 7.7*   PT/INR No results for input(s): "LABPROT", "INR" in the last 72 hours. ABG No results for input(s): "PHART", "HCO3" in the last 72 hours.  Invalid input(s): "PCO2", "PO2"  Studies/Results: CT ABDOMEN PELVIS W CONTRAST  Result Date: 09/27/2022 CLINICAL DATA:  Gross microscopic hematuria. Urinary retention. History of prostate cancer. EXAM: CT ABDOMEN AND PELVIS WITH CONTRAST TECHNIQUE: Multidetector CT imaging of the abdomen and pelvis was  performed using the standard protocol following bolus administration of intravenous contrast. RADIATION DOSE REDUCTION: This exam was performed according to the departmental dose-optimization program which includes automated exposure control, adjustment of the mA and/or kV according to patient size and/or use of iterative reconstruction technique. CONTRAST:  141m OMNIPAQUE IOHEXOL 300 MG/ML  SOLN COMPARISON:  CT abdomen and pelvis without contrast 09/26/2021; PET-CT 11/10/2021 FINDINGS: Lower chest: There is bilateral posterior lower lobe ground-glass subsegmental atelectasis. A 3 mm left lower lobe pulmonary nodule is unchanged from 09/2021 and 05/12/2021 prior CTs. This is likely benign and no follow-up imaging is recommended. Hepatobiliary: Smooth liver contours. No focal liver lesion is seen. Tiny gallstones are again visualized. No inflammatory father while changes are seen. No intrahepatic or extrahepatic biliary ductal dilatation. Pancreas: Unremarkable. No pancreatic ductal dilatation or surrounding inflammatory changes. Spleen: A small splenule is again seen inferolateral to the spleen. The spleen enhances homogeneously and is normal in size. Adrenals/Urinary Tract: Normal adrenals. The kidneys enhance uniformly and are symmetric in size without hydronephrosis. There is again a fluid density left mid to upper pole lateral cyst measuring up to 6.8 cm. No follow-up imaging is recommended. The bilateral ureters are normal in caliber. The urinary bladder is decompressed by indwelling Foley catheter. Mild fat stranding anterior to the urinary bladder appears new from prior 09/27/2021 noncontrast CT and 11/10/2021 noncontrast PET-CT. This may be secondary to mild cystitis. Recommend clinical correlation for possible radiation therapy in the pelvis, as this fat stranding may be the sequela of radiation therapy changes. On delayed phase images, contrast opacifies the bilateral renal collecting systems and the  proximal aspect of the bilateral ureters without a filling defects seen. Stomach/Bowel: Mild-to-moderate sigmoid and mild descending colon diverticulosis without inflammatory changes to indicate diverticulitis, unchanged from prior. The terminal ileum is unremarkable. Normal caliber of the appendix without surrounding inflammatory fat stranding. At least 3 appendicoliths, measuring up to 2 mm (axial series 2, image 77) appear new from prior. Redemonstration of small sliding hiatal hernia. No dilated loops of bowel to indicate bowel obstruction. Vascular/Lymphatic: No abdominal aortic aneurysm. Mild-to-moderate vascular calcifications. The major intra-abdominal aortic branch vessels appear patent. A left external iliac 1.8 x 1.7 cm lymph node (axial series 2, image 83) is mildly increased from 1.3 x 1.2 cm on 09/26/2021 and measured in a similar manner. Enlarged right external iliac chain lymph nodes are again seen, overall increased in size from prior. Reference 2.1 x 2.0 cm right external iliac lymph node (axial series 2, image 82) is increased from 1.3 x 1.7 cm previously when measured in a similar manner.  Multiple more proximal right external iliac chain lymph nodes are also increased in size compared to prior. Reproductive: The prostate is surgically absent. Prostate radiotherapy seeds are again seen. The seminal vesicles are present and appear unchanged. Other: Upper abdominal pain broad-mouthed, shallow midline ventral fat containing hernia with hernia orifice measuring up to approximately 6 cm x 7 cm (transverse by AP) and with the ventral herniated mesenteric fat measuring only 1 cm (axial series 2, image 44), unchanged from prior. Mild right-greater-than-left fat containing inguinal hernias are unchanged. No free air or free fluid is seen within the abdomen or pelvis. Musculoskeletal: Moderate anterior T11 vertebral body height loss and moderate superior left-greater-than-right L2 endplate degenerative  Schmorl's nodes are unchanged. No aggressive lytic or blastic osseous lesion is seen. IMPRESSION: Compared to 09/26/2021: 1. The urinary bladder is decompressed by indwelling Foley catheter. There is new mild fat stranding anterior to the urinary bladder. This may be secondary to mild cystitis. Recommend clinical correlation for possible radiation therapy changes in the pelvis, as this fat stranding may also be the sequela of radiation therapy changes. 2. Compared to 09/26/2021 and 11/10/2021 noncontrast CT, there is interval increase in size of bilateral external iliac chain lymphadenopathy. This is again concerning for metastatic disease. 3. Minimal cholelithiasis. 4. Colonic diverticulosis without inflammatory changes to indicate diverticulitis. Electronically Signed   By: Yvonne Kendall M.D.   On: 09/27/2022 17:27   DG Chest Portable 1 View  Result Date: 09/27/2022 CLINICAL DATA:  Bladder spasms.  Urinary retention. EXAM: PORTABLE CHEST 1 VIEW COMPARISON:  Chest two views 05/05/2015 FINDINGS: Cardiac silhouette is again moderately enlarged, noting magnification from AP technique. The thoracic aorta is again mildly tortuous. Mild-to-moderate calcifications within the aortic arch. Mild bibasilar horizontal linear subsegmental atelectasis versus scarring. No definite pleural effusion. No pneumothorax. Moderate multilevel disc space narrowing and bridging osteophytes of the thoracic spine. IMPRESSION: 1. No acute lung process. 2. Moderate cardiomegaly.  Aortic atherosclerosis. Electronically Signed   By: Yvonne Kendall M.D.   On: 09/27/2022 16:59     Assessment & Plan: Gross hematuria with clot retention - resoloved History of prostate cancer s/p radiation tx History of radiation cystitis   D/C foley OK to discharge home from urology standpoint after patient voids Urine culture negative - no additional antibiotics needed Patient will need outpatient follow-up with Dr. Junious Silk in the next 3-4  weeks  Michaelle Birks, MD 09/29/2022

## 2022-10-02 LAB — CULTURE, BLOOD (ROUTINE X 2)
Culture: NO GROWTH
Culture: NO GROWTH
Special Requests: ADEQUATE

## 2022-10-10 ENCOUNTER — Encounter: Payer: Self-pay | Admitting: Urology

## 2022-10-10 ENCOUNTER — Ambulatory Visit (INDEPENDENT_AMBULATORY_CARE_PROVIDER_SITE_OTHER): Payer: Medicare HMO | Admitting: Urology

## 2022-10-10 VITALS — BP 143/91 | HR 91

## 2022-10-10 DIAGNOSIS — R310 Gross hematuria: Secondary | ICD-10-CM | POA: Diagnosis not present

## 2022-10-10 DIAGNOSIS — R9721 Rising PSA following treatment for malignant neoplasm of prostate: Secondary | ICD-10-CM | POA: Diagnosis not present

## 2022-10-10 LAB — URINALYSIS, ROUTINE W REFLEX MICROSCOPIC
Bilirubin, UA: NEGATIVE
Glucose, UA: NEGATIVE
Ketones, UA: NEGATIVE
Leukocytes,UA: NEGATIVE
Nitrite, UA: NEGATIVE
Specific Gravity, UA: 1.02 (ref 1.005–1.030)
Urobilinogen, Ur: 0.2 mg/dL (ref 0.2–1.0)
pH, UA: 7.5 (ref 5.0–7.5)

## 2022-10-10 LAB — MICROSCOPIC EXAMINATION

## 2022-10-10 MED ORDER — FINASTERIDE 5 MG PO TABS: 5.0000 mg | ORAL_TABLET | Freq: Every day | ORAL | 3 refills | Status: DC

## 2022-10-10 MED ORDER — TAMSULOSIN HCL 0.4 MG PO CAPS: 0.4000 mg | ORAL_CAPSULE | Freq: Every day | ORAL | 3 refills | Status: AC

## 2022-10-10 NOTE — Progress Notes (Signed)
10/10/2022 1:17 PM   Mason Aguilar [birth year] [id number]  Referring provider: [name], MD [place],  [place] [address]  No chief complaint on file.   HPI:  F/u    1) F/u -    1) h/o PCa - he was tx with ADT and XRT in 2015 for a unfavorable intermediate risk prostate cancer diagnosed in October 2015 with pretreatment PSA of 5.8 and reported SV invasion on MRI with negative bone scan and prostate volume of 60 g.    PSA is rising: 01/20 PSA 0.46 11/21 PSA 1.01 04/22 PSA 1.6  10/22 PSA 1.5 - PSADT about 20 mo and 0.4 /year PSAV   Primary treatment: 2015 ADT and EBXRT    Staging:  05/22 CT non-con - no LAD or bone lesions 07/22 MRI abd - benign  10/22 CT non-con - new 12 mm right ext iliac lymph node, no bone lesions  11/22 PSMA PET scan - focal right SV activity, no metastatic disease - I reviewed the images  10/23 CT a/p with - left and right external iliac LAD slight increase since 10/22 scan. No bone.    Started 5ari for hematuria Nov 2022.    2)  gross hematuria- patient with gross hematuria with clots December 2021. Hematuria episode 10/22. Cystoscopy 10/22 benign. Friable prostate and bladder neck vessels. He is on finasteride and tamsulosin.   Imaging - as above.     He returns a bit early due to a gross hematuria episode Oct 2023. He also saw some blood Jun 2023. CT Oct 2023 reviewed with slight increase ext iliac LAD though PSA was stable at 1.30 Jun 2022.   He is here with his niece Mason Aguilar. [place] lives with her. Call her with results - [phone].   PMH: Past Medical History:  Diagnosis Date   HOH (hard of hearing)    Hypercholesteremia    Hypertension    Prostate cancer [facility])     Surgical History: Past Surgical History:  Procedure Laterality Date   arm surgery     OPEN REDUCTION INTERNAL FIXATION (ORIF) METACARPAL Right 06/03/2021   Procedure: OPEN REDUCTION INTERNAL FIXATION (ORIF)RIGHT FIFTH  METACARPAL;  Surgeon: Daryll Brod, MD;  Location: [place];  Service: Orthopedics;  Laterality: Right;  AXILLARY BLOCK   polyps      Home Medications:  Allergies as of 10/10/2022   No Known Allergies      Medication List        Accurate as of October 10, 2022  1:17 PM. If you have any questions, ask your nurse or doctor.          alendronate 70 MG tablet Commonly known as: FOSAMAX Take 70 mg by mouth once a week.   aspirin EC 81 MG tablet Take 1 tablet (81 mg total) by mouth daily with breakfast. Swallow whole.   cetirizine 10 MG tablet Commonly known as: ZYRTEC Take 10 mg by mouth daily.   finasteride 5 MG tablet Commonly known as: PROSCAR Take 1 tablet (5 mg total) by mouth daily.   gabapentin 300 MG capsule Commonly known as: NEURONTIN Take 1 capsule by mouth 3 (three) times daily.   losartan 50 MG tablet Commonly known as: COZAAR Take 1 tablet by mouth daily.   omeprazole 40 MG capsule Commonly known as: PRILOSEC Take 1 capsule by mouth daily.   oxyCODONE-acetaminophen 10-325 MG tablet Commonly known as: PERCOCET Take 1 tablet by mouth 4 (four) times daily as  needed for pain.   polyethylene glycol 17 g packet Commonly known as: MIRALAX / GLYCOLAX Take 17 g by mouth daily.   simvastatin 40 MG tablet Commonly known as: ZOCOR Take 40 mg by mouth at bedtime.   tamsulosin 0.4 MG Caps capsule Commonly known as: FLOMAX Take 1 capsule (0.4 mg total) by mouth daily.   Vitamin D-3 25 MCG (1000 UT) Caps Take 1 capsule by mouth daily.        Allergies: No Known Allergies  Family History: No family history on file.  Social History:  reports that he has never smoked. He has never used smokeless tobacco. He reports that he does not drink alcohol and does not use drugs.   Physical Exam: There were no vitals taken for this visit.  Constitutional:  Alert and oriented, No acute distress. HEENT: [place] AT, moist mucus membranes.  Trachea midline, no  masses. Cardiovascular: No clubbing, cyanosis, or edema. Respiratory: Normal respiratory effort, no increased work of breathing. GI: Abdomen is soft, nontender, nondistended, no abdominal masses GU: No CVA tenderness Skin: No rashes, bruises or suspicious lesions. Neurologic: Grossly intact, no focal deficits, moving all 4 extremities. Psychiatric: Normal mood and affect.  Laboratory Data: Lab Results  Component Value Date   WBC 13.6 (H) 09/29/2022   HGB 11.5 (L) 09/29/2022   HCT 36.9 (L) 09/29/2022   MCV 89.3 09/29/2022   PLT 134 (L) 09/29/2022    Lab Results  Component Value Date   CREATININE 0.85 09/28/2022    No results found for: "PSA"  No results found for: "TESTOSTERONE"  No results found for: "HGBA1C"  Urinalysis    Component Value Date/Time   COLORURINE RED (A) 09/27/2022 1132   APPEARANCEUR TURBID (A) 09/27/2022 1132   APPEARANCEUR Clear 07/11/2022 1541   LABSPEC  09/27/2022 1132    TEST NOT REPORTED DUE TO COLOR INTERFERENCE OF URINE PIGMENT   PHURINE  09/27/2022 1132    TEST NOT REPORTED DUE TO COLOR INTERFERENCE OF URINE PIGMENT   GLUCOSEU (A) 09/27/2022 1132    TEST NOT REPORTED DUE TO COLOR INTERFERENCE OF URINE PIGMENT   HGBUR (A) 09/27/2022 1132    TEST NOT REPORTED DUE TO COLOR INTERFERENCE OF URINE PIGMENT   BILIRUBINUR (A) 09/27/2022 1132    TEST NOT REPORTED DUE TO COLOR INTERFERENCE OF URINE PIGMENT   BILIRUBINUR Negative 07/11/2022 1541   KETONESUR (A) 09/27/2022 1132    TEST NOT REPORTED DUE TO COLOR INTERFERENCE OF URINE PIGMENT   PROTEINUR (A) 09/27/2022 1132    TEST NOT REPORTED DUE TO COLOR INTERFERENCE OF URINE PIGMENT   NITRITE (A) 09/27/2022 1132    TEST NOT REPORTED DUE TO COLOR INTERFERENCE OF URINE PIGMENT   LEUKOCYTESUR (A) 09/27/2022 1132    TEST NOT REPORTED DUE TO COLOR INTERFERENCE OF URINE PIGMENT    Lab Results  Component Value Date   LABMICR Comment 07/11/2022   WBCUA None seen 09/27/2021   LABEPIT None seen  09/27/2021   MUCUS Present 09/27/2021   BACTERIA NONE SEEN 09/27/2022    Pertinent Imaging: Pet scan 2022 images compared to CTs in 2022 and 2023    Assessment & Plan:    Gross hematuria -  he does not want to do hyperbaric. I'll check cystoscopy again here and consider OR fulguration.   Rising PSA, pelvic LAD  - PSA and T sent. Consider PET scan. Disc nature r/b/a to ADT and po meds.    No follow-ups on file.  Festus Aloe, MD  [facility]  [address] [place], [place] [address] [phone]

## 2022-10-11 LAB — PSA: Prostate Specific Ag, Serum: 1.6 ng/mL (ref 0.0–4.0)

## 2022-10-11 LAB — TESTOSTERONE: Testosterone: 425 ng/dL (ref 264–916)

## 2022-10-12 NOTE — Progress Notes (Signed)
Letter sent.

## 2022-10-14 ENCOUNTER — Telehealth: Payer: Self-pay

## 2022-10-14 NOTE — Telephone Encounter (Signed)
Niece Roanna Epley called in and states that patient is having gross hematuria when voiding. Made niece aware that I can scheduled patient with the PA. On 10/24. Roanna Epley voiced understanding

## 2022-10-18 ENCOUNTER — Ambulatory Visit: Payer: Medicare HMO | Admitting: Physician Assistant

## 2022-10-31 ENCOUNTER — Encounter: Payer: Self-pay | Admitting: Urology

## 2022-10-31 ENCOUNTER — Ambulatory Visit (INDEPENDENT_AMBULATORY_CARE_PROVIDER_SITE_OTHER): Payer: Medicare HMO | Admitting: Urology

## 2022-10-31 VITALS — BP 148/93 | HR 106

## 2022-10-31 DIAGNOSIS — R310 Gross hematuria: Secondary | ICD-10-CM

## 2022-10-31 LAB — POCT URINALYSIS DIPSTICK
Bilirubin, UA: NEGATIVE
Blood, UA: NEGATIVE
Glucose, UA: NEGATIVE
Ketones, UA: NEGATIVE
Leukocytes, UA: NEGATIVE
Nitrite, UA: NEGATIVE
Protein, UA: NEGATIVE
Spec Grav, UA: 1.02 (ref 1.010–1.025)
Urobilinogen, UA: NEGATIVE E.U./dL — AB
pH, UA: 7 (ref 5.0–8.0)

## 2022-10-31 MED ORDER — CIPROFLOXACIN HCL 500 MG PO TABS
500.0000 mg | ORAL_TABLET | Freq: Once | ORAL | Status: AC
  Administered 2022-10-31: 500 mg via ORAL

## 2022-10-31 NOTE — Progress Notes (Signed)
   10/31/22  CC: gross hematuria. PSA recurrence after prostate cancer treatment   HPI:  F/u -     1) rising PSA p PCa treatment - he was tx with ADT and XRT in 2015 for a unfavorable intermediate risk prostate cancer diagnosed in October 2015 with pretreatment PSA of 5.8 and reported SV invasion on MRI with negative bone scan and prostate volume of 60 g.    PSA is rising: 01/20 PSA 0.46 11/21 PSA 1.01 04/22 PSA 1.6  10/22 PSA 1.5 - PSADT about 20 mo and 0.4 /year PSAV   Primary treatment: 2015 ADT and EBXRT    Staging:  05/22 CT non-con - no LAD or bone lesions 07/22 MRI abd - benign  10/22 CT non-con - new 12 mm right ext iliac lymph node, no bone lesions  11/22 PSMA PET scan - focal right SV activity, no metastatic disease - I reviewed the images  10/23 CT a/p with - left and right external iliac LAD slight increase since 10/22 scan. No bone.    Started 5ari for hematuria Nov 2022.    2)  gross hematuria- patient with gross hematuria with clots December 2021. Hematuria episode 10/22. Cystoscopy 10/22 benign. Friable prostate and bladder neck vessels. He is on finasteride and tamsulosin.    Imaging - as above.     He returns in management of the above and for PSA/PCa management. He had recurrent gross hematuria episode Oct 2023. He also saw some blood Jun 2023. CT Oct 2023 reviewed with slight increase ext iliac LAD though PSA was stable at 1.30 Jun 2022. His Oct 2023 PSA is stable at 1.6.   No further gross hematuria.     He is here with his niece [name]. [place] lives with her. Call her with results - [phone].   There were no vitals taken for this visit. NED. A&Ox3.   No respiratory distress   Abd soft, NT, ND Normal phallus with bilateral descended testicles  Cystoscopy Procedure Note  Patient identification was confirmed, informed consent was obtained, and patient was prepped using Betadine solution.  Lidocaine jelly was administered per urethral meatus.      Pre-Procedure: - Inspection reveals a normal caliber ureteral meatus.  Procedure: The flexible cystoscope was introduced without difficulty - No urethral strictures/lesions are present. - prostate borderline obs. No median lobe. Friable vessels proximal prostatic urethra and BN.  -  bladder neck normal / patent  - Bilateral ureteral orifices identified - Bladder mucosa  reveals no ulcers, tumors, or lesions - No bladder stones - No trabeculation  Retroflexion shows normal bladder, a few blood vessels    Post-Procedure: - Patient tolerated the procedure well  Assessment/ Plan:  1) gross hematuria - benign eval. Disc cysto and fulguration. They will consider.   2) PSA recurrence - his PSA remains stable. Repeat in 6 mo.   No follow-ups on file.  Festus Aloe, MD

## 2022-11-14 ENCOUNTER — Other Ambulatory Visit: Payer: Medicare HMO | Admitting: Urology

## 2023-01-02 ENCOUNTER — Other Ambulatory Visit: Payer: Medicare HMO

## 2023-01-09 ENCOUNTER — Ambulatory Visit: Payer: Medicare HMO | Admitting: Urology

## 2023-04-24 ENCOUNTER — Other Ambulatory Visit: Payer: Medicare HMO

## 2023-05-01 ENCOUNTER — Ambulatory Visit: Payer: Medicare HMO | Admitting: Urology

## 2023-05-01 ENCOUNTER — Other Ambulatory Visit: Payer: Medicare (Managed Care)

## 2023-05-01 DIAGNOSIS — Z8546 Personal history of malignant neoplasm of prostate: Secondary | ICD-10-CM

## 2023-05-02 LAB — PSA: Prostate Specific Ag, Serum: 1.6 ng/mL (ref 0.0–4.0)

## 2023-05-08 ENCOUNTER — Ambulatory Visit: Payer: Medicare (Managed Care) | Admitting: Urology

## 2023-05-08 ENCOUNTER — Encounter: Payer: Self-pay | Admitting: Urology

## 2023-05-08 VITALS — BP 147/92 | HR 98

## 2023-05-08 DIAGNOSIS — Z8546 Personal history of malignant neoplasm of prostate: Secondary | ICD-10-CM

## 2023-05-08 DIAGNOSIS — R9721 Rising PSA following treatment for malignant neoplasm of prostate: Secondary | ICD-10-CM | POA: Diagnosis not present

## 2023-05-08 DIAGNOSIS — R3912 Poor urinary stream: Secondary | ICD-10-CM

## 2023-05-08 NOTE — Progress Notes (Signed)
05/08/2023 3:13 PM   Mason Aguilar Dec 10, 1946 [id number]  Referring provider: [name], MD [address] [place],  [place] [address]  No chief complaint on file.   HPI:  F/u -     1) rising PSA p PCa treatment - he was tx with ADT and XRT in 2015 for a unfavorable intermediate risk prostate cancer diagnosed in October 2015 with pretreatment PSA of 5.8 and reported SV invasion on MRI with negative bone scan and prostate volume of 60 g. Started 5ari for hematuria Nov 2022.    PSA is rising: 01/20 PSA 0.46 11/21 PSA 1.01 04/22 PSA 1.6  10/22 PSA 1.5 - PSADT about 20 mo and 0.4 /year PSAV Oct 2023 PSA is stable at 1.6.    Primary treatment: 2015 ADT and EBXRT    Staging:  05/22 CT non-con - no LAD or bone lesions 07/22 MRI abd - benign  10/22 CT non-con - new 12 mm right ext iliac lymph node, no bone lesions  11/22 PSMA PET scan - focal right SV activity, no metastatic disease - I reviewed the images  10/23 CT a/p with - left and right external iliac LAD slight increase since 10/22 scan. No bone.    2)  gross hematuria- patient with gross hematuria with clots December 2021. Hematuria episode 10/22. Cystoscopy 10/22 benign. Friable prostate and bladder neck vessels. He is on finasteride and tamsulosin.    Imaging - as above.  Cysto benign again Nov 2023.    He returns in management of the above. His May 2024 PSA stable at 1.6. No further gross hematuria. LUTS improved.    He is here with his niece Marinell Blight. [name] lives with her. Call her with results - [phone].    PMH: Past Medical History:  Diagnosis Date   HOH (hard of hearing)    Hypercholesteremia    Hypertension    Prostate cancer [facility])     Surgical History: Past Surgical History:  Procedure Laterality Date   arm surgery     OPEN REDUCTION INTERNAL FIXATION (ORIF) METACARPAL Right 06/03/2021   Procedure: OPEN REDUCTION INTERNAL FIXATION (ORIF)RIGHT FIFTH  METACARPAL;  Surgeon: Cindee Salt, MD;   Location: [facility];  Service: Orthopedics;  Laterality: Right;  AXILLARY BLOCK   polyps      Home Medications:  Allergies as of 05/08/2023   No Known Allergies      Medication List        Accurate as of May 08, 2023  3:13 PM. If you have any questions, ask your nurse or doctor.          alendronate 70 MG tablet Commonly known as: FOSAMAX Take 70 mg by mouth once a week.   aspirin EC 81 MG tablet Take 1 tablet (81 mg total) by mouth daily with breakfast. Swallow whole.   cetirizine 10 MG tablet Commonly known as: ZYRTEC Take 10 mg by mouth daily.   finasteride 5 MG tablet Commonly known as: PROSCAR Take 1 tablet (5 mg total) by mouth daily.   gabapentin 300 MG capsule Commonly known as: NEURONTIN Take 1 capsule by mouth 3 (three) times daily.   losartan 50 MG tablet Commonly known as: COZAAR Take 1 tablet by mouth daily.   omeprazole 40 MG capsule Commonly known as: PRILOSEC Take 1 capsule by mouth daily.   oxyCODONE-acetaminophen 10-325 MG tablet Commonly known as: PERCOCET Take 1 tablet by mouth 4 (four) times daily as needed for pain.   polyethylene  glycol 17 g packet Commonly known as: MIRALAX / GLYCOLAX Take 17 g by mouth daily.   simvastatin 40 MG tablet Commonly known as: ZOCOR Take 40 mg by mouth at bedtime.   tamsulosin 0.4 MG Caps capsule Commonly known as: FLOMAX Take 1 capsule (0.4 mg total) by mouth daily.   Vitamin D-3 25 MCG (1000 UT) Caps Take 1 capsule by mouth daily.        Allergies: No Known Allergies  Family History: No family history on file.  Social History:  reports that he has never smoked. He has never used smokeless tobacco. He reports that he does not drink alcohol and does not use drugs.   Physical Exam: There were no vitals taken for this visit.  Constitutional:  Alert and oriented, No acute distress. HEENT:  AT, moist mucus membranes.  Trachea midline, no masses. Cardiovascular: No  clubbing, cyanosis, or edema. Respiratory: Normal respiratory effort, no increased work of breathing. GI: Abdomen is soft, nontender, nondistended, no abdominal masses GU: No CVA tenderness Skin: No rashes, bruises or suspicious lesions. Neurologic: Grossly intact, no focal deficits, moving all 4 extremities. Psychiatric: Normal mood and affect.  Laboratory Data: Lab Results  Component Value Date   WBC 13.6 (H) 09/29/2022   HGB 11.5 (L) 09/29/2022   HCT 36.9 (L) 09/29/2022   MCV 89.3 09/29/2022   PLT 134 (L) 09/29/2022    Lab Results  Component Value Date   CREATININE 0.85 09/28/2022    No results found for: "PSA"  Lab Results  Component Value Date   TESTOSTERONE 425 10/10/2022    No results found for: "HGBA1C"  Urinalysis    Component Value Date/Time   COLORURINE RED (A) 09/27/2022 1132   APPEARANCEUR Clear 10/10/2022 1331   LABSPEC  09/27/2022 1132    TEST NOT REPORTED DUE TO COLOR INTERFERENCE OF URINE PIGMENT   PHURINE  09/27/2022 1132    TEST NOT REPORTED DUE TO COLOR INTERFERENCE OF URINE PIGMENT   GLUCOSEU Negative 10/10/2022 1331   HGBUR (A) 09/27/2022 1132    TEST NOT REPORTED DUE TO COLOR INTERFERENCE OF URINE PIGMENT   BILIRUBINUR neg 10/31/2022 1425   BILIRUBINUR Negative 10/10/2022 1331   KETONESUR (A) 09/27/2022 1132    TEST NOT REPORTED DUE TO COLOR INTERFERENCE OF URINE PIGMENT   PROTEINUR Negative 10/31/2022 1425   PROTEINUR Trace (A) 10/10/2022 1331   PROTEINUR (A) 09/27/2022 1132    TEST NOT REPORTED DUE TO COLOR INTERFERENCE OF URINE PIGMENT   UROBILINOGEN negative (A) 10/31/2022 1425   NITRITE neg 10/31/2022 1425   NITRITE Negative 10/10/2022 1331   NITRITE (A) 09/27/2022 1132    TEST NOT REPORTED DUE TO COLOR INTERFERENCE OF URINE PIGMENT   LEUKOCYTESUR Negative 10/31/2022 1425   LEUKOCYTESUR Negative 10/10/2022 1331   LEUKOCYTESUR (A) 09/27/2022 1132    TEST NOT REPORTED DUE TO COLOR INTERFERENCE OF URINE PIGMENT    Lab Results   Component Value Date   LABMICR See below: 10/10/2022   WBCUA 0-5 10/10/2022   LABEPIT 0-10 10/10/2022   MUCUS Present 09/27/2021   BACTERIA Few (A) 10/10/2022    Pertinent Imaging:  Results for orders placed during the hospital encounter of 09/26/21  CT Renal Stone Study  Narrative CLINICAL DATA:  Hematuria. History of prostate cancer. Right flank pain.  EXAM: CT ABDOMEN AND PELVIS WITHOUT CONTRAST  TECHNIQUE: Multidetector CT imaging of the abdomen and pelvis was performed following the standard protocol without IV contrast.  COMPARISON:  MRI abdomen 07/07/2021. CT  chest abdomen and pelvis 05/09/2021.  FINDINGS: Lower chest: There is a stable 3 mm nodular density in the left lower lobe image 4/6. Visualized lung bases are otherwise clear.  Hepatobiliary: No focal liver abnormality is seen. Small gallstones are present. There is no biliary ductal dilatation.  Pancreas: Unremarkable. No pancreatic ductal dilatation or surrounding inflammatory changes.  Spleen: Normal in size without focal abnormality.  Adrenals/Urinary Tract: There is no evidence for urinary tract calculus or hydronephrosis. There is a 6.1 cm left renal cyst which is similar to the prior study. Bilateral adrenal glands are within normal limits. There is heterogeneous hyperdensity within the posterior bladder measured 2.1 x 2.3 by 3.0 cm. This has ill-defined borders. The bladder is otherwise within normal limits.  Stomach/Bowel: There is a small hiatal hernia. Stomach is otherwise within normal limits. Appendix appears normal. No evidence of bowel wall thickening, distention, or inflammatory changes. There is sigmoid colon diverticulosis without evidence for acute diverticulitis.  Vascular/Lymphatic: Aortic atherosclerosis. No evidence for aneurysm. There are mildly enlarged right external iliac lymph nodes measuring up to 12 mm, unchanged from prior.  Reproductive: Prostate radiotherapy  seeds are again noted.  Other: There are small fat containing bilateral inguinal hernias. There is no free fluid or free air.  Musculoskeletal: Degenerative changes affect the spine and hips. There is stable mild compression deformity of T11.  IMPRESSION: 1. Heterogeneous hyperdensity in the posterior bladder worrisome for hemorrhage. Cannot exclude underlying bladder lesion. 2. No hydronephrosis or urinary tract calculus. 3. Colonic diverticulosis without evidence for diverticulitis. 4. Stable mildly enlarged external iliac chain lymph nodes. 5.  Aortic Atherosclerosis (ICD10-I70.0).   Electronically Signed By: Darliss Cheney M.D. On: 09/26/2021 23:28   Assessment & Plan:    Rising PSA p PCa tx - stable  Weak st - cont tams and finasteride   H/o PCa   No follow-ups on file.  Jerilee Field, MD  [facility]  [address] [place], [place] [address] [phone]

## 2023-05-09 LAB — URINALYSIS, ROUTINE W REFLEX MICROSCOPIC
Bilirubin, UA: NEGATIVE
Glucose, UA: NEGATIVE
Ketones, UA: NEGATIVE
Leukocytes,UA: NEGATIVE
Nitrite, UA: NEGATIVE
Protein,UA: NEGATIVE
RBC, UA: NEGATIVE
Specific Gravity, UA: 1.02 (ref 1.005–1.030)
Urobilinogen, Ur: 0.2 mg/dL (ref 0.2–1.0)
pH, UA: 6 (ref 5.0–7.5)

## 2023-10-24 ENCOUNTER — Encounter: Payer: Self-pay | Admitting: Oncology

## 2023-10-24 ENCOUNTER — Inpatient Hospital Stay: Payer: Medicare (Managed Care)

## 2023-10-24 ENCOUNTER — Inpatient Hospital Stay: Payer: Medicare (Managed Care) | Attending: Oncology | Admitting: Oncology

## 2023-10-24 VITALS — BP 131/80 | HR 86 | Temp 98.2°F | Resp 18 | Wt 238.0 lb

## 2023-10-24 DIAGNOSIS — D72829 Elevated white blood cell count, unspecified: Secondary | ICD-10-CM

## 2023-10-24 DIAGNOSIS — Z8546 Personal history of malignant neoplasm of prostate: Secondary | ICD-10-CM | POA: Diagnosis not present

## 2023-10-24 LAB — COMPREHENSIVE METABOLIC PANEL
ALT: 22 U/L (ref 0–44)
AST: 30 U/L (ref 15–41)
Albumin: 4.2 g/dL (ref 3.5–5.0)
Alkaline Phosphatase: 60 U/L (ref 38–126)
Anion gap: 9 (ref 5–15)
BUN: 12 mg/dL (ref 8–23)
CO2: 25 mmol/L (ref 22–32)
Calcium: 9.3 mg/dL (ref 8.9–10.3)
Chloride: 104 mmol/L (ref 98–111)
Creatinine, Ser: 0.92 mg/dL (ref 0.61–1.24)
GFR, Estimated: >60 mL/min (ref >60)
Glucose, Bld: 126 mg/dL — ABNORMAL HIGH (ref 70–99)
Potassium: 4 mmol/L (ref 3.5–5.1)
Sodium: 138 mmol/L (ref 135–145)
Total Bilirubin: 1.1 mg/dL (ref 0.3–1.2)
Total Protein: 7.5 g/dL (ref 6.5–8.1)

## 2023-10-24 LAB — CBC WITH DIFFERENTIAL/PLATELET
Abs Immature Granulocytes: 0.05 10*3/uL (ref 0.00–0.07)
Basophils Absolute: 0.1 10*3/uL (ref 0.0–0.1)
Basophils Relative: 0 %
Eosinophils Absolute: 0.2 10*3/uL (ref 0.0–0.5)
Eosinophils Relative: 1 %
HCT: 44 % (ref 39.0–52.0)
Hemoglobin: 14.2 g/dL (ref 13.0–17.0)
Immature Granulocytes: 0 %
Lymphocytes Relative: 84 %
Lymphs Abs: 20.7 10*3/uL — ABNORMAL HIGH (ref 0.7–4.0)
MCH: 27.6 pg (ref 26.0–34.0)
MCHC: 32.3 g/dL (ref 30.0–36.0)
MCV: 85.6 fL (ref 80.0–100.0)
Monocytes Absolute: 0.6 10*3/uL (ref 0.1–1.0)
Monocytes Relative: 2 %
Neutro Abs: 3.2 10*3/uL (ref 1.7–7.7)
Neutrophils Relative %: 13 %
Platelets: 183 10*3/uL (ref 150–400)
RBC: 5.14 MIL/uL (ref 4.22–5.81)
RDW: 13.9 % (ref 11.5–15.5)
WBC: 24.8 10*3/uL — ABNORMAL HIGH (ref 4.0–10.5)
nRBC: 0.1 % (ref 0.0–0.2)

## 2023-10-24 LAB — SEDIMENTATION RATE: Sed Rate: 5 mm/h (ref 0–16)

## 2023-10-24 LAB — LACTATE DEHYDROGENASE: LDH: 131 U/L (ref 98–192)

## 2023-10-24 LAB — C-REACTIVE PROTEIN: CRP: 0.6 mg/dL (ref <1.0)

## 2023-10-24 NOTE — Assessment & Plan Note (Signed)
Persistent leukocytosis with concern for possible CLL.  Other differentials include infection or inflammation.  No symptoms of weight loss, night sweats, fever, or chills. No recent infections noted. History of prostate cancer in remission for 10 years. -Order additional blood work to further evaluate the cause of leukocytosis including a flow cytometry. -Follow-up appointment in two weeks to discuss lab results.

## 2023-10-24 NOTE — Assessment & Plan Note (Signed)
Patient has a history of prostate cancer diagnosed in 2015 s/p EBRT and ADT being followed by urology.  Latest PSA of 1.82. -Continue to follow-up with urology

## 2023-10-24 NOTE — Progress Notes (Signed)
[facility] at [facility] HEMATOLOGY NEW VISIT  Mason Real, MD  REASON FOR REFERRAL: Leukocytosis  HISTORY OF PRESENT ILLNESS: Mason Aguilar 77 y.o. male referred for leukocytosis.  He is accompanied by his friend who he lives with.  He has a past medical history of prostate cancer in remission since 2015.He also has a past medical history of hyperlipidemia.  He is a limited historian due to significant hearing loss.  Patient had a recent athlete's foot for which he was given antifungals for.He denies weight loss, night sweats, fever, and chills. He reports occasional back and side pain, but denies fatigue or shortness of breath. He has a history of urinary burning every six months or so, but not recently.He denies any recent infections, steroid use, smoking, or alcohol use.  I have reviewed the past medical history, past surgical history, social history and family history with the patient   ALLERGIES:  has No Known Allergies.  MEDICATIONS:  Current Outpatient Medications  Medication Sig Dispense Refill   alendronate (FOSAMAX) 70 MG tablet Take 70 mg by mouth once a week.     aspirin EC 81 MG tablet Take 1 tablet (81 mg total) by mouth daily with breakfast. Swallow whole. 30 tablet 11   cetirizine (ZYRTEC) 10 MG tablet Take 10 mg by mouth daily.     Cholecalciferol (VITAMIN D-3) 25 MCG (1000 UT) CAPS Take 1 capsule by mouth daily.     finasteride (PROSCAR) 5 MG tablet Take 1 tablet (5 mg total) by mouth daily. 90 tablet 3   gabapentin (NEURONTIN) 300 MG capsule Take 1 capsule by mouth 3 (three) times daily.     losartan (COZAAR) 50 MG tablet Take 1 tablet by mouth daily.     omeprazole (PRILOSEC) 40 MG capsule Take 1 capsule by mouth daily.     oxyCODONE-acetaminophen (PERCOCET) 10-325 MG tablet Take 1 tablet by mouth 4 (four) times daily as needed for pain.     polyethylene glycol (MIRALAX / GLYCOLAX) 17 g packet Take 17 g by mouth daily. 30 each 4   simvastatin  (ZOCOR) 40 MG tablet Take 40 mg by mouth at bedtime.     tamsulosin (FLOMAX) 0.4 MG CAPS capsule Take 1 capsule (0.4 mg total) by mouth daily. 90 capsule 3   No current facility-administered medications for this visit.     REVIEW OF SYSTEMS:   Constitutional: Denies fevers, chills or night sweats Eyes: Denies blurriness of vision Ears, nose, mouth, throat, and face: Denies mucositis or sore throat Respiratory: Denies cough, dyspnea or wheezes Cardiovascular: Denies palpitation, chest discomfort or lower extremity swelling Gastrointestinal:  Denies nausea, heartburn or change in bowel habits Skin: Denies abnormal skin rashes Lymphatics: Denies new lymphadenopathy or easy bruising Neurological:Denies numbness, tingling or new weaknesses Behavioral/Psych: Mood is stable, no new changes  All other systems were reviewed with the patient and are negative.  PHYSICAL EXAMINATION:   Vitals:   10/24/23 1107  BP: 131/80  Pulse: 86  Resp: 18  Temp: 98.2 F (36.8 C)  SpO2: 98%    GENERAL:alert, no distress and comfortable SKIN: skin color, texture, turgor are normal, no rashes or significant lesions NECK: supple, thyroid normal size, non-tender, without nodularity LYMPH:  no palpable lymphadenopathy in the cervical, axillary or inguinal LUNGS: clear to auscultation and percussion with normal breathing effort HEART: regular rate & rhythm and no murmurs and no lower extremity edema ABDOMEN:abdomen soft, non-tender and normal bowel sounds Musculoskeletal:no cyanosis of digits and no  clubbing  NEURO: alert & oriented x 3 with fluent speech  LABORATORY DATA:  I have reviewed the data as listed and also from his primary care under records  Labs from 08/18/2023 CBC: WBC: 26.1, hemoglobin: 13 hematocrit: 41.3, MCV: 89, platelets: 201 Neutrophils: 15.5%, lymphocytes: 80.8%  Labs from 07/20/2023: CMP: WNL CBC: WBC: 23.1, hemoglobin: 13.6, hematocrit: 43.4, MCV: 88 Neutrophil: 25.9%,  lymphocytes: 70.9% PSA: 1.82  Lab Results  Component Value Date   WBC 13.6 (H) 09/29/2022   NEUTROABS 6.6 09/26/2021   HGB 11.5 (L) 09/29/2022   HCT 36.9 (L) 09/29/2022   MCV 89.3 09/29/2022   PLT 134 (L) 09/29/2022      Component Value Date/Time   NA 137 09/28/2022 0009   K 4.1 09/28/2022 0009   CL 107 09/28/2022 0009   CO2 24 09/28/2022 0009   GLUCOSE 106 (H) 09/28/2022 0009   BUN 10 09/28/2022 0009   CREATININE 0.85 09/28/2022 0009   CALCIUM 7.7 (L) 09/28/2022 0009   GFRNONAA >60 09/28/2022 0009      Chemistry      Component Value Date/Time   NA 137 09/28/2022 0009   K 4.1 09/28/2022 0009   CL 107 09/28/2022 0009   CO2 24 09/28/2022 0009   BUN 10 09/28/2022 0009   CREATININE 0.85 09/28/2022 0009      Component Value Date/Time   CALCIUM 7.7 (L) 09/28/2022 0009      ASSESSMENT & PLAN:  Patient is a 77 year old male referred for leukocytosis with lymphocyte predominance  Leukocytosis Persistent leukocytosis with concern for possible CLL.  Other differentials include infection or inflammation.  No symptoms of weight loss, night sweats, fever, or chills. No recent infections noted. History of prostate cancer in remission for 10 years. -Order additional blood work to further evaluate the cause of leukocytosis including a flow cytometry. -Follow-up appointment in two weeks to discuss lab results.  History of prostate cancer Patient has a history of prostate cancer diagnosed in 2015 s/p EBRT and ADT being followed by urology.  Latest PSA of 1.82. -Continue to follow-up with urology   Orders Placed This Encounter  Procedures   CBC with Differential/Platelet    Standing Status:   Future    Number of Occurrences:   1    Standing Expiration Date:   10/23/2024   Comprehensive metabolic panel    Standing Status:   Future    Number of Occurrences:   1    Standing Expiration Date:   10/23/2024   Lactate dehydrogenase    Standing Status:   Future    Number of  Occurrences:   1    Standing Expiration Date:   10/23/2024   Sedimentation rate    Standing Status:   Future    Number of Occurrences:   1    Standing Expiration Date:   10/23/2024   C-reactive protein    Standing Status:   Future    Number of Occurrences:   1    Standing Expiration Date:   10/23/2024    The total time spent in the appointment was 30 minutes encounter with patients including review of chart and various tests results, discussions about plan of care and coordination of care plan   All questions were answered. The patient knows to call the clinic with any problems, questions or concerns. No barriers to learning was detected.   Cindie Crumbly, MD 10/29/202411:37 AM

## 2023-10-24 NOTE — Patient Instructions (Signed)
VISIT SUMMARY:  You were seen today to evaluate an elevated white blood cell count. You have a history of prostate cancer in remission for ten years and have been experiencing occasional back and side pain. You also have a recent episode of athlete's foot that is improving with treatment.  YOUR PLAN:  -ELEVATED WHITE BLOOD CELL COUNT: An elevated white blood cell count can indicate an infection or other medical conditions, including blood cancer. We will conduct additional blood work to determine the cause and will discuss the results at your follow-up appointment in two weeks.  -ATHLETE'S FOOT: Athlete's foot is a fungal infection that affects the skin on the feet. Continue using the prescribed cream as it is helping to improve the condition.  -PROSTATE CANCER: Your prostate cancer has been in remission for ten years, and there are no current symptoms or complaints. We will continue with the current management and regular surveillance.  -GENERAL HEALTH MAINTENANCE: Consider having a skin examination for sun-related skin lesions noted on your head. Routine labs and studies are normal. Maintain a healthy lifestyle with a balanced diet and regular exercise.  INSTRUCTIONS:  Please complete the additional blood work as soon as possible. We will discuss the results at your follow-up appointment in two weeks.

## 2023-11-08 ENCOUNTER — Encounter: Payer: Self-pay | Admitting: Oncology

## 2023-11-08 ENCOUNTER — Inpatient Hospital Stay: Payer: Medicare (Managed Care) | Attending: Oncology | Admitting: Oncology

## 2023-11-08 ENCOUNTER — Inpatient Hospital Stay: Payer: Medicare (Managed Care)

## 2023-11-08 VITALS — BP 154/98 | HR 93 | Temp 97.6°F | Resp 18 | Wt 242.8 lb

## 2023-11-08 DIAGNOSIS — C9110 Chronic lymphocytic leukemia of B-cell type not having achieved remission: Secondary | ICD-10-CM

## 2023-11-08 DIAGNOSIS — Z8546 Personal history of malignant neoplasm of prostate: Secondary | ICD-10-CM | POA: Diagnosis not present

## 2023-11-08 DIAGNOSIS — D72829 Elevated white blood cell count, unspecified: Secondary | ICD-10-CM | POA: Insufficient documentation

## 2023-11-08 DIAGNOSIS — D72820 Lymphocytosis (symptomatic): Secondary | ICD-10-CM

## 2023-11-08 LAB — CBC WITH DIFFERENTIAL/PLATELET
Abs Immature Granulocytes: 0 10*3/uL (ref 0.00–0.07)
Basophils Absolute: 0 10*3/uL (ref 0.0–0.1)
Basophils Relative: 0 %
Eosinophils Absolute: 0 10*3/uL (ref 0.0–0.5)
Eosinophils Relative: 0 %
HCT: 43.3 % (ref 39.0–52.0)
Hemoglobin: 13.9 g/dL (ref 13.0–17.0)
Lymphocytes Relative: 74 %
Lymphs Abs: 18.2 10*3/uL — ABNORMAL HIGH (ref 0.7–4.0)
MCH: 27.5 pg (ref 26.0–34.0)
MCHC: 32.1 g/dL (ref 30.0–36.0)
MCV: 85.6 fL (ref 80.0–100.0)
Monocytes Absolute: 0 10*3/uL — ABNORMAL LOW (ref 0.1–1.0)
Monocytes Relative: 0 %
Neutro Abs: 5.9 10*3/uL (ref 1.7–7.7)
Neutrophils Relative %: 24 %
Platelets: 182 10*3/uL (ref 150–400)
RBC: 5.06 MIL/uL (ref 4.22–5.81)
RDW: 13.9 % (ref 11.5–15.5)
WBC: 24.6 10*3/uL — ABNORMAL HIGH (ref 4.0–10.5)
nRBC: 0 % (ref 0.0–0.2)

## 2023-11-08 LAB — URIC ACID: Uric Acid, Serum: 6.7 mg/dL (ref 3.7–8.6)

## 2023-11-08 LAB — LACTATE DEHYDROGENASE: LDH: 171 U/L (ref 98–192)

## 2023-11-08 NOTE — Assessment & Plan Note (Addendum)
Leukocytosis with lymphocyte predominance.  CBC highly suggestive of CLL with smudge cells.  No B symptoms at this time.  No anemia and thrombocytopenia. -Will send a flow cytometry and CLL FISH for confirmation -Patient does not meet treatment criteria at this time -Will follow-up in 3 months or sooner if new symptoms arise

## 2023-11-08 NOTE — Progress Notes (Addendum)
[facility] HEMATOLOGY FOLLOW-UP VISIT  Mason Real, MD  REASON FOR FOLLOW-UP: Leukocytosis  ASSESSMENT & PLAN:  Patient is a 77 year old male with past medical history of prostate cancer following up for leukocytosis.  Leukocytosis Leukocytosis with lymphocyte predominance.  CBC highly suggestive of CLL with smudge cells.  No B symptoms at this time.  No anemia and thrombocytopenia. -Will send a flow cytometry and CLL FISH for confirmation -Patient does not meet treatment criteria at this time -Will follow-up in 3 months or sooner if new symptoms arise  History of prostate cancer Patient has a history of prostate cancer diagnosed in 2015 s/p EBRT and ADT being followed by urology.  Latest PSA of 1.82. -Continue to follow-up with urology   Orders Placed This Encounter  Procedures   CBC with Differential/Platelet    Aguilar Status:   Future    Number of Occurrences:   1    Expected Date:   11/08/2023    Expiration Date:   11/07/2024   Flow Cytometry, Peripheral Blood (Oncology)    Aguilar Status:   Future    Number of Occurrences:   1    Expected Date:   11/08/2023    Expiration Date:   11/07/2024   Lactate dehydrogenase    Aguilar Status:   Future    Number of Occurrences:   1    Expected Date:   11/08/2023    Expiration Date:   11/07/2024   Uric acid    Aguilar Status:   Future    Number of Occurrences:   1    Expected Date:   11/08/2023    Expiration Date:   11/07/2024   Chronic Lymphocytic Leukemia (CLL) Profile, FISH    Aguilar Status:   Future    Number of Occurrences:   1    Expected Date:   11/08/2023    Expiration Date:   11/07/2024   CBC with Differential/Platelet    Aguilar Status:   Future    Number of Occurrences:   1    Expected Date:   02/01/2024    Expiration Date:   11/07/2024   Comprehensive metabolic panel    Aguilar Status:   Future    Number of Occurrences:   1    Expected Date:   02/01/2024     Expiration Date:   11/07/2024   Lactate dehydrogenase    Aguilar Status:   Future    Number of Occurrences:   1    Expected Date:   02/01/2024    Expiration Date:   11/07/2024    The total time spent in the appointment was 15 minutes encounter with patients including review of chart and various tests results, discussions about plan of care and coordination of care plan   All questions were answered. The patient knows to call the clinic with any problems, questions or concerns. No barriers to learning was detected.  Mason Crumbly, MD 2/20/20259:31 AM   SUMMARY OF HEMATOLOGIC HISTORY:    Latest Ref Rng & Units 02/08/2024    2:18 PM 11/08/2023   10:22 AM 10/24/2023   11:27 AM  CBC  WBC 4.0 - 10.5 K/uL 28.0  24.6  24.8   Hemoglobin 13.0 - 17.0 g/dL 16.1  09.6  04.5   Hematocrit 39.0 - 52.0 % 43.1  43.3  44.0   Platelets 150 - 400 K/uL 221  182  183     INTERVAL HISTORY: Discussed the use of  AI scribe software for clinical note transcription with the patient, who gave verbal consent to proceed.   Mason Aguilar 77 y.o. male is here for follow-up for leukocytosis. He reports no current symptoms such as fatigue, night sweats, fever, chills, or significant weight loss. He is accompanied by his care taker today who he also lives with. Patient is at baseline and has no new complaints.    I have reviewed the past medical history, past surgical history, social history and family history with the patient   ALLERGIES:  has no known allergies.  MEDICATIONS:  Current Outpatient Medications  Medication Sig Dispense Refill   alendronate (FOSAMAX) 70 MG tablet Take 70 mg by mouth once a week.     aspirin EC 81 MG tablet Take 1 tablet (81 mg total) by mouth daily with breakfast. Swallow whole. 30 tablet 11   calcium acetate (PHOSLO) 667 MG capsule Take 667 mg by mouth 2 (two) times daily.     cetirizine (ZYRTEC) 10 MG tablet Take 10 mg by mouth daily.     Cholecalciferol (VITAMIN D-3) 25 MCG  (1000 UT) CAPS Take 1 capsule by mouth daily.     gabapentin (NEURONTIN) 300 MG capsule Take 1 capsule by mouth 3 (three) times daily.     losartan (COZAAR) 50 MG tablet Take 1 tablet by mouth daily.     omeprazole (PRILOSEC) 40 MG capsule Take 1 capsule by mouth daily.     oxyCODONE-acetaminophen (PERCOCET) 10-325 MG tablet Take 1 tablet by mouth 4 (four) times daily as needed for pain.     polyethylene glycol (MIRALAX / GLYCOLAX) 17 g packet Take 17 g by mouth daily. 30 each 4   simvastatin (ZOCOR) 40 MG tablet Take 40 mg by mouth at bedtime.     tamsulosin (FLOMAX) 0.4 MG CAPS capsule Take 1 capsule (0.4 mg total) by mouth daily. 90 capsule 3   finasteride (PROSCAR) 5 MG tablet Take 1 tablet (5 mg total) by mouth daily. 90 tablet 3   No current facility-administered medications for this visit.     REVIEW OF SYSTEMS:   Constitutional: Denies fevers, chills or night sweats Eyes: Denies blurriness of vision Ears, nose, mouth, throat, and face: Denies mucositis or sore throat Respiratory: Denies cough, dyspnea or wheezes Cardiovascular: Denies palpitation, chest discomfort or lower extremity swelling Gastrointestinal:  Denies nausea, heartburn or change in bowel habits Skin: Denies abnormal skin rashes Lymphatics: Denies new lymphadenopathy or easy bruising Neurological:Denies numbness, tingling or new weaknesses Behavioral/Psych: Mood is stable, no new changes  All other systems were reviewed with the patient and are negative.  PHYSICAL EXAMINATION:   Vitals:   11/08/23 0952  BP: (!) 154/98  Pulse: 93  Resp: 18  Temp: 97.6 F (36.4 C)  SpO2: 95%    GENERAL:alert, no distress and comfortable SKIN: skin color, texture, turgor are normal, no rashes or significant lesions LYMPH:  no palpable lymphadenopathy in the cervical, axillary or inguinal LUNGS: clear to auscultation and percussion with normal breathing effort HEART: regular rate & rhythm and no murmurs and no lower  extremity edema ABDOMEN:abdomen soft, non-tender and normal bowel sounds Musculoskeletal:no cyanosis of digits and no clubbing  NEURO: alert & oriented x 3 with fluent speech  LABORATORY DATA:  I have reviewed the data as listed  Lab Results  Component Value Date   WBC 28.0 (H) 02/08/2024   NEUTROABS 2.5 02/08/2024   HGB 13.5 02/08/2024   HCT 43.1 02/08/2024   MCV 86.5 02/08/2024  PLT 221 02/08/2024      Component Value Date/Time   NA 140 02/08/2024 1418   K 4.1 02/08/2024 1418   CL 109 02/08/2024 1418   CO2 23 02/08/2024 1418   GLUCOSE 98 02/08/2024 1418   BUN 11 02/08/2024 1418   CREATININE 0.88 02/08/2024 1418   CALCIUM 9.2 02/08/2024 1418   PROT 7.2 02/08/2024 1418   ALBUMIN 4.2 02/08/2024 1418   AST 30 02/08/2024 1418   ALT 23 02/08/2024 1418   ALKPHOS 83 02/08/2024 1418   BILITOT 0.7 02/08/2024 1418   GFRNONAA >60 02/08/2024 1418       Chemistry      Component Value Date/Time   NA 140 02/08/2024 1418   K 4.1 02/08/2024 1418   CL 109 02/08/2024 1418   CO2 23 02/08/2024 1418   BUN 11 02/08/2024 1418   CREATININE 0.88 02/08/2024 1418      Component Value Date/Time   CALCIUM 9.2 02/08/2024 1418   ALKPHOS 83 02/08/2024 1418   AST 30 02/08/2024 1418   ALT 23 02/08/2024 1418   BILITOT 0.7 02/08/2024 1418

## 2023-11-08 NOTE — Patient Instructions (Signed)
VISIT SUMMARY:  You came in today for a follow-up visit after recent blood work suggested a possible diagnosis of leukemia. Given your history of prostate cancer and family history of cancer, you expressed concern about this potential diagnosis. You reported no current symptoms such as fatigue, night sweats, fever, chills, or significant weight loss. Your caretaker was also present and expressed concern.  YOUR PLAN:  -SUSPECTED CLL: CLL is a type of cancer that affects the blood and bone marrow. Your recent blood work suggests a mild form of leukemia that typically does not require treatment. We have ordered confirmatory blood work today. If confirmed, we will monitor your condition with regular blood work every few months. No treatment is planned at this time unless symptoms develop. Please follow up in three months or sooner if you experience significant weight loss.  -PROSTATE CANCER: You have a history of prostate cancer, but there are no current symptoms or treatments needed at this time. We will continue with the current management plan without any changes.  INSTRUCTIONS:  Please return for follow-up in three months. If leukemia is confirmed, a nurse will call you with the results. If you experience significant weight loss or any new symptoms, please contact us immediately.

## 2023-11-08 NOTE — Assessment & Plan Note (Signed)
Patient has a history of prostate cancer diagnosed in 2015 s/p EBRT and ADT being followed by urology.  Latest PSA of 1.82. -Continue to follow-up with urology

## 2023-11-10 LAB — SURGICAL PATHOLOGY

## 2023-11-13 ENCOUNTER — Other Ambulatory Visit: Payer: Self-pay

## 2023-11-13 ENCOUNTER — Ambulatory Visit: Payer: Medicare (Managed Care) | Admitting: Urology

## 2023-11-13 LAB — FLOW CYTOMETRY

## 2023-11-13 MED ORDER — FINASTERIDE 5 MG PO TABS: 5.0000 mg | ORAL_TABLET | Freq: Every day | ORAL | 0 refills | Status: DC

## 2023-11-15 LAB — FISH HES LEUKEMIA, 4Q12 REA

## 2023-11-27 ENCOUNTER — Ambulatory Visit: Payer: Medicare (Managed Care) | Admitting: Urology

## 2023-11-27 VITALS — BP 120/82 | HR 89

## 2023-11-27 DIAGNOSIS — R3912 Poor urinary stream: Secondary | ICD-10-CM | POA: Diagnosis not present

## 2023-11-27 DIAGNOSIS — N401 Enlarged prostate with lower urinary tract symptoms: Secondary | ICD-10-CM | POA: Diagnosis not present

## 2023-11-27 DIAGNOSIS — R9721 Rising PSA following treatment for malignant neoplasm of prostate: Secondary | ICD-10-CM

## 2023-11-27 DIAGNOSIS — N138 Other obstructive and reflux uropathy: Secondary | ICD-10-CM

## 2023-11-27 LAB — URINALYSIS, ROUTINE W REFLEX MICROSCOPIC
Bilirubin, UA: NEGATIVE
Glucose, UA: NEGATIVE
Ketones, UA: NEGATIVE
Leukocytes,UA: NEGATIVE
Nitrite, UA: NEGATIVE
Protein,UA: NEGATIVE
RBC, UA: NEGATIVE
Specific Gravity, UA: 1.02 (ref 1.005–1.030)
Urobilinogen, Ur: 0.2 mg/dL (ref 0.2–1.0)
pH, UA: 7 (ref 5.0–7.5)

## 2023-11-27 MED ORDER — FINASTERIDE 5 MG PO TABS: 5.0000 mg | ORAL_TABLET | Freq: Every day | ORAL | 3 refills | Status: DC

## 2023-11-27 NOTE — Progress Notes (Unsigned)
11/27/2023 1:27 PM   Barnetta Hammersmith 1946-03-31 [id number]  Referring provider: [name], MD [address] [place],  [place] [address]  No chief complaint on file.   HPI:  F/u -     1) rising PSA p PCa treatment - he was tx with ADT and XRT in 2015 for a unfavorable intermediate risk prostate cancer diagnosed in October 2015 with pretreatment PSA of 5.8 and reported SV invasion on MRI with negative bone scan and prostate volume of 60 g. His PSA rose to 1.01 in 2021. His 2022 PSA was 1.6. Started 5ari for hematuria Nov 2022. His 2023 PSA stable at 1.6, but with left and right external iliac LAD.His May 2024 PSA stable at 1.6.   Primary treatment: 2015 ADT and EBXRT    Staging:  05/22 CT non-con - no LAD or bone lesions 07/22 MRI abd - benign  10/22 CT non-con - new 12 mm right ext iliac lymph node, no bone lesions  11/22 [facility] PET scan - focal right SV activity, no metastatic disease - I reviewed the images  10/23 CT a/p with - left and right external iliac LAD slight increase since 10/22 scan. No bone.    2)  gross hematuria- patient with gross hematuria with clots December 2021. Hematuria episode 10/22. Cystoscopy 10/22 benign. Friable prostate and bladder neck vessels. He is on finasteride and tamsulosin. Imaging - as above.  Cysto benign again Nov 2023.    He returns in management of the above. Continues finasteride. No gross hematuria. Saw Dr. Anders Simmonds and has CLL under surveillance. No treatment needed at this time.    He is here with his niece Marinell Blight. Ishaaq lives with her. Call her with results - [phone].      PMH: Past Medical History:  Diagnosis Date   HOH (hard of hearing)    Hypercholesteremia    Hypertension    Prostate cancer [facility])     Surgical History: Past Surgical History:  Procedure Laterality Date   arm surgery     OPEN REDUCTION INTERNAL FIXATION (ORIF) METACARPAL Right 06/03/2021   Procedure: OPEN REDUCTION INTERNAL FIXATION (ORIF)RIGHT  FIFTH  METACARPAL;  Surgeon: Cindee Salt, MD;  Location: [facility];  Service: Orthopedics;  Laterality: Right;  AXILLARY BLOCK   polyps      Home Medications:  Allergies as of 11/27/2023   No Known Allergies      Medication List        Accurate as of November 27, 2023  1:27 PM. If you have any questions, ask your nurse or doctor.          alendronate 70 MG tablet Commonly known as: FOSAMAX Take 70 mg by mouth once a week.   aspirin EC 81 MG tablet Take 1 tablet (81 mg total) by mouth daily with breakfast. Swallow whole.   calcium acetate 667 MG capsule Commonly known as: PHOSLO Take 667 mg by mouth 2 (two) times daily.   cetirizine 10 MG tablet Commonly known as: ZYRTEC Take 10 mg by mouth daily.   finasteride 5 MG tablet Commonly known as: PROSCAR Take 1 tablet (5 mg total) by mouth daily.   gabapentin 300 MG capsule Commonly known as: NEURONTIN Take 1 capsule by mouth 3 (three) times daily.   losartan 50 MG tablet Commonly known as: COZAAR Take 1 tablet by mouth daily.   omeprazole 40 MG capsule Commonly known as: PRILOSEC Take 1 capsule by mouth daily.   oxyCODONE-acetaminophen 10-325 MG  tablet Commonly known as: PERCOCET Take 1 tablet by mouth 4 (four) times daily as needed for pain.   polyethylene glycol 17 g packet Commonly known as: MIRALAX / GLYCOLAX Take 17 g by mouth daily.   simvastatin 40 MG tablet Commonly known as: ZOCOR Take 40 mg by mouth at bedtime.   tamsulosin 0.4 MG Caps capsule Commonly known as: FLOMAX Take 1 capsule (0.4 mg total) by mouth daily.   Vitamin D-3 25 MCG (1000 UT) Caps Take 1 capsule by mouth daily.        Allergies: No Known Allergies  Family History: No family history on file.  Social History:  reports that he has never smoked. He has never used smokeless tobacco. He reports that he does not drink alcohol and does not use drugs.   Physical Exam: BP 120/82   Pulse 89    Constitutional:  Alert and oriented, No acute distress. HEENT: [place] AT, moist mucus membranes.  Trachea midline, no masses. Cardiovascular: No clubbing, cyanosis, or edema. Respiratory: Normal respiratory effort, no increased work of breathing. GI: Abdomen is soft, nontender, nondistended, no abdominal masses GU: No CVA tenderness Skin: No rashes, bruises or suspicious lesions. Neurologic: Grossly intact, no focal deficits, moving all 4 extremities. Psychiatric: Normal mood and affect.  Laboratory Data: Lab Results  Component Value Date   WBC 24.6 (H) 11/08/2023   HGB 13.9 11/08/2023   HCT 43.3 11/08/2023   MCV 85.6 11/08/2023   PLT 182 11/08/2023    Lab Results  Component Value Date   CREATININE 0.92 10/24/2023    No results found for: "PSA"  Lab Results  Component Value Date   TESTOSTERONE 425 10/10/2022    No results found for: "HGBA1C"  Urinalysis    Component Value Date/Time   COLORURINE RED (A) 09/27/2022 1132   APPEARANCEUR Clear 05/08/2023 1535   LABSPEC  09/27/2022 1132    TEST NOT REPORTED DUE TO COLOR INTERFERENCE OF URINE PIGMENT   PHURINE  09/27/2022 1132    TEST NOT REPORTED DUE TO COLOR INTERFERENCE OF URINE PIGMENT   GLUCOSEU Negative 05/08/2023 1535   HGBUR (A) 09/27/2022 1132    TEST NOT REPORTED DUE TO COLOR INTERFERENCE OF URINE PIGMENT   BILIRUBINUR Negative 05/08/2023 1535   KETONESUR (A) 09/27/2022 1132    TEST NOT REPORTED DUE TO COLOR INTERFERENCE OF URINE PIGMENT   PROTEINUR Negative 05/08/2023 1535   PROTEINUR (A) 09/27/2022 1132    TEST NOT REPORTED DUE TO COLOR INTERFERENCE OF URINE PIGMENT   UROBILINOGEN negative (A) 10/31/2022 1425   NITRITE Negative 05/08/2023 1535   NITRITE (A) 09/27/2022 1132    TEST NOT REPORTED DUE TO COLOR INTERFERENCE OF URINE PIGMENT   LEUKOCYTESUR Negative 05/08/2023 1535   LEUKOCYTESUR (A) 09/27/2022 1132    TEST NOT REPORTED DUE TO COLOR INTERFERENCE OF URINE PIGMENT    Lab Results  Component  Value Date   LABMICR Comment 05/08/2023   WBCUA 0-5 10/10/2022   LABEPIT 0-10 10/10/2022   MUCUS Present 09/27/2021   BACTERIA Few (A) 10/10/2022    Pertinent Imaging:   Results for orders placed during the hospital encounter of 09/26/21  CT Renal Stone Study  Narrative CLINICAL DATA:  Hematuria. History of prostate cancer. Right flank pain.  EXAM: CT ABDOMEN AND PELVIS WITHOUT CONTRAST  TECHNIQUE: Multidetector CT imaging of the abdomen and pelvis was performed following the standard protocol without IV contrast.  COMPARISON:  MRI abdomen 07/07/2021. CT chest abdomen and pelvis 05/09/2021.  FINDINGS: Lower  chest: There is a stable 3 mm nodular density in the left lower lobe image 4/6. Visualized lung bases are otherwise clear.  Hepatobiliary: No focal liver abnormality is seen. Small gallstones are present. There is no biliary ductal dilatation.  Pancreas: Unremarkable. No pancreatic ductal dilatation or surrounding inflammatory changes.  Spleen: Normal in size without focal abnormality.  Adrenals/Urinary Tract: There is no evidence for urinary tract calculus or hydronephrosis. There is a 6.1 cm left renal cyst which is similar to the prior study. Bilateral adrenal glands are within normal limits. There is heterogeneous hyperdensity within the posterior bladder measured 2.1 x 2.3 by 3.0 cm. This has ill-defined borders. The bladder is otherwise within normal limits.  Stomach/Bowel: There is a small hiatal hernia. Stomach is otherwise within normal limits. Appendix appears normal. No evidence of bowel wall thickening, distention, or inflammatory changes. There is sigmoid colon diverticulosis without evidence for acute diverticulitis.  Vascular/Lymphatic: Aortic atherosclerosis. No evidence for aneurysm. There are mildly enlarged right external iliac lymph nodes measuring up to 12 mm, unchanged from prior.  Reproductive: Prostate radiotherapy seeds are again  noted.  Other: There are small fat containing bilateral inguinal hernias. There is no free fluid or free air.  Musculoskeletal: Degenerative changes affect the spine and hips. There is stable mild compression deformity of T11.  IMPRESSION: 1. Heterogeneous hyperdensity in the posterior bladder worrisome for hemorrhage. Cannot exclude underlying bladder lesion. 2. No hydronephrosis or urinary tract calculus. 3. Colonic diverticulosis without evidence for diverticulitis. 4. Stable mildly enlarged external iliac chain lymph nodes. 5.  Aortic Atherosclerosis (ICD10-I70.0).   Electronically Signed By: Darliss Cheney M.D. On: 09/26/2021 23:28   Assessment & Plan:    1. Rising PSA following treatment of PCa - PSA has been stable. Small pelvic LAD. PSA was sent. F/u 6 mo. Consider a PET scan and sooner f/u if needed.  - Urinalysis, Routine w reflex microscopic  Addendum: PSA up to 2.8 so we will restage with PET scan.   2. BPH - continue finasteride. Refilled.   No follow-ups on file.  Jerilee Field, MD  [facility]  [address] [place], [place] [address] [phone]

## 2023-11-28 LAB — PSA: Prostate Specific Ag, Serum: 2.8 ng/mL (ref 0.0–4.0)

## 2023-12-01 ENCOUNTER — Telehealth: Payer: Self-pay

## 2023-12-01 NOTE — Telephone Encounter (Signed)
Called Pt and left a voicemail informing him of his PSA results. Also let him know we will work on getting his needed scans authorized through insurance, after that scheduling will contact him.

## 2023-12-01 NOTE — Telephone Encounter (Signed)
-----   Message from Jerilee Field sent at 11/29/2023  2:40 PM EST ----- Please let patient/niece know his PSA has risen to 2.8. I'm going to get a full body scan to look for any prostate cancer. I'll let them know what the scan shows. Thanks ----- Message ----- From: Sarajane Jews, CMA Sent: 11/28/2023   8:35 AM EST To: Jerilee Field, MD  Please Review

## 2023-12-01 NOTE — Telephone Encounter (Signed)
Niece called back and she was informed of PSA results and our next steps moving forward.

## 2024-01-29 ENCOUNTER — Ambulatory Visit (HOSPITAL_COMMUNITY): Admission: RE | Admit: 2024-01-29 | Payer: Medicare (Managed Care) | Source: Ambulatory Visit

## 2024-01-29 ENCOUNTER — Ambulatory Visit (HOSPITAL_COMMUNITY)
Admission: RE | Admit: 2024-01-29 | Discharge: 2024-01-29 | Disposition: A | Discharge status: Home / Self Care | Payer: Medicare (Managed Care) | Source: Ambulatory Visit | Attending: Urology | Admitting: Urology

## 2024-01-29 DIAGNOSIS — R9721 Rising PSA following treatment for malignant neoplasm of prostate: Secondary | ICD-10-CM | POA: Insufficient documentation

## 2024-01-29 MED ORDER — FLOTUFOLASTAT F 18 GALLIUM 296-5846 MBQ/ML IV SOLN
9.0700 | Freq: Once | INTRAVENOUS | Status: AC
  Administered 2024-01-29: 9.07 via INTRAVENOUS

## 2024-02-01 ENCOUNTER — Inpatient Hospital Stay: Payer: Medicare (Managed Care) | Attending: Oncology

## 2024-02-01 DIAGNOSIS — C61 Malignant neoplasm of prostate: Secondary | ICD-10-CM | POA: Insufficient documentation

## 2024-02-01 DIAGNOSIS — C9110 Chronic lymphocytic leukemia of B-cell type not having achieved remission: Secondary | ICD-10-CM | POA: Insufficient documentation

## 2024-02-01 DIAGNOSIS — Z923 Personal history of irradiation: Secondary | ICD-10-CM | POA: Insufficient documentation

## 2024-02-08 ENCOUNTER — Inpatient Hospital Stay: Payer: Medicare (Managed Care)

## 2024-02-08 ENCOUNTER — Inpatient Hospital Stay: Payer: Medicare (Managed Care) | Admitting: Oncology

## 2024-02-08 DIAGNOSIS — Z923 Personal history of irradiation: Secondary | ICD-10-CM | POA: Diagnosis not present

## 2024-02-08 DIAGNOSIS — C61 Malignant neoplasm of prostate: Secondary | ICD-10-CM | POA: Diagnosis not present

## 2024-02-08 DIAGNOSIS — D72820 Lymphocytosis (symptomatic): Secondary | ICD-10-CM

## 2024-02-08 DIAGNOSIS — C9110 Chronic lymphocytic leukemia of B-cell type not having achieved remission: Secondary | ICD-10-CM | POA: Diagnosis present

## 2024-02-08 LAB — CBC WITH DIFFERENTIAL/PLATELET
Abs Immature Granulocytes: 0 10*3/uL (ref 0.00–0.07)
Basophils Absolute: 0 10*3/uL (ref 0.0–0.1)
Basophils Relative: 0 %
Eosinophils Absolute: 0.3 10*3/uL (ref 0.0–0.5)
Eosinophils Relative: 1 %
HCT: 43.1 % (ref 39.0–52.0)
Hemoglobin: 13.5 g/dL (ref 13.0–17.0)
Lymphocytes Relative: 87 %
Lymphs Abs: 24.4 10*3/uL — ABNORMAL HIGH (ref 0.7–4.0)
MCH: 27.1 pg (ref 26.0–34.0)
MCHC: 31.3 g/dL (ref 30.0–36.0)
MCV: 86.5 fL (ref 80.0–100.0)
Monocytes Absolute: 0.8 10*3/uL (ref 0.1–1.0)
Monocytes Relative: 3 %
Neutro Abs: 2.5 10*3/uL (ref 1.7–7.7)
Neutrophils Relative %: 9 %
Platelets: 221 10*3/uL (ref 150–400)
RBC: 4.98 MIL/uL (ref 4.22–5.81)
RDW: 14.1 % (ref 11.5–15.5)
Smear Review: ADEQUATE
WBC: 28 10*3/uL — ABNORMAL HIGH (ref 4.0–10.5)
nRBC: 0 % (ref 0.0–0.2)

## 2024-02-08 LAB — COMPREHENSIVE METABOLIC PANEL
ALT: 23 U/L (ref 0–44)
AST: 30 U/L (ref 15–41)
Albumin: 4.2 g/dL (ref 3.5–5.0)
Alkaline Phosphatase: 83 U/L (ref 38–126)
Anion gap: 8 (ref 5–15)
BUN: 11 mg/dL (ref 8–23)
CO2: 23 mmol/L (ref 22–32)
Calcium: 9.2 mg/dL (ref 8.9–10.3)
Chloride: 109 mmol/L (ref 98–111)
Creatinine, Ser: 0.88 mg/dL (ref 0.61–1.24)
GFR, Estimated: >60 mL/min (ref >60)
Glucose, Bld: 98 mg/dL (ref 70–99)
Potassium: 4.1 mmol/L (ref 3.5–5.1)
Sodium: 140 mmol/L (ref 135–145)
Total Bilirubin: 0.7 mg/dL (ref 0.0–1.2)
Total Protein: 7.2 g/dL (ref 6.5–8.1)

## 2024-02-08 LAB — LACTATE DEHYDROGENASE: LDH: 131 U/L (ref 98–192)

## 2024-02-15 ENCOUNTER — Inpatient Hospital Stay: Payer: Medicare (Managed Care) | Admitting: Oncology

## 2024-02-16 ENCOUNTER — Inpatient Hospital Stay: Payer: Medicare (Managed Care) | Admitting: Oncology

## 2024-02-21 ENCOUNTER — Inpatient Hospital Stay (HOSPITAL_BASED_OUTPATIENT_CLINIC_OR_DEPARTMENT_OTHER): Payer: Medicare (Managed Care) | Admitting: Oncology

## 2024-02-21 VITALS — BP 149/91 | HR 88 | Temp 98.5°F | Resp 20 | Wt 241.0 lb

## 2024-02-21 DIAGNOSIS — C61 Malignant neoplasm of prostate: Secondary | ICD-10-CM | POA: Diagnosis not present

## 2024-02-21 DIAGNOSIS — C9110 Chronic lymphocytic leukemia of B-cell type not having achieved remission: Secondary | ICD-10-CM | POA: Diagnosis not present

## 2024-02-21 NOTE — Patient Instructions (Signed)
 VISIT SUMMARY:  You came in today for a routine follow-up. We discussed your chronic leukemia, which remains stable and does not require treatment at this time. We also reviewed your history of prostate cancer, noting a possible recurrence based on a recent PET scan. Additionally, we addressed your recent weight loss and a fall you experienced at home.  YOUR PLAN:  -CHRONIC LYMPHOCYTIC LEUKEMIA (CLL): Chronic Lymphocytic Leukemia is a type of cancer that affects the blood and bone marrow. Your condition is stable, and no treatment is needed at this time. We will continue to monitor your health and have you return for a follow-up in three months.  -PROSTATE CANCER: Prostate cancer is a type of cancer that occurs in the prostate gland. A recent PET scan suggests a possible recurrence of your prostate cancer. We will reach out to Dr. Mena Goes to discuss further management, which may include local treatment such as radiation.    INSTRUCTIONS:  Please return for a follow-up appointment in three months. We will also reach out to Dr. Mena Goes to discuss the management plan for your prostate cancer.

## 2024-02-21 NOTE — Progress Notes (Signed)
 [facility] at [facility] HEMATOLOGY FOLLOW-UP VISIT  [name], MD  REASON FOR FOLLOW-UP: CLL  ASSESSMENT & PLAN:  Patient is a 78 year old male with past medical history of prostate cancer and CLL   CLL (chronic lymphocytic leukemia) (HCC) Presence of leukocytosis with no anemia or thrombocytopenia.  Diffuse adenopathy present on PSMA PET scan but with low uptake/SUV.  No splenomegaly.  Patient has no B symptoms at this time.  RAI stage I. -Continue to monitor counts for now. -Patient does not meet criteria for treatment at this time.  Return to clinic in 3 months with labs.  Malignant neoplasm of prostate [facility]) Patient has a history of follow-up prostate cancer s/p XRT and ADT in 2015.  Recent PSA trending up and PET scan showed local recurrence.  Following up with Dr. Mena Goes for treatment. -Continue to follow with Dr. Mena Goes   Orders Placed This Encounter  Procedures   CBC with Differential/Platelet    Standing Status:   Future    Expected Date:   06/17/2024    Expiration Date:   02/20/2025   Comprehensive metabolic panel    Standing Status:   Future    Expected Date:   06/17/2024    Expiration Date:   02/20/2025   Lactate dehydrogenase    Standing Status:   Future    Expected Date:   06/17/2024    Expiration Date:   02/20/2025   Uric acid    Standing Status:   Future    Expected Date:   06/17/2024    Expiration Date:   02/20/2025    The total time spent in the appointment was 20 minutes encounter with patients including review of chart and various tests results, discussions about plan of care and coordination of care plan   All questions were answered. The patient knows to call the clinic with any problems, questions or concerns. No barriers to learning was detected.  Cindie Crumbly, MD 2/26/20254:11 PM    Oncology History  CLL (chronic lymphocytic leukemia) (HCC)  11/08/2023 Cancer Staging   Staging form: Chronic Lymphocytic Leukemia /  Small Lymphocytic Lymphoma, AJCC 8th Edition - Clinical stage from 11/08/2023: Modified Rai Stage I (Modified Rai risk: Intermediate, Binet: Stage B, Lymphocytosis: Present, Adenopathy: Present, Organomegaly: Absent, Anemia: Absent, Thrombocytopenia: Absent) - Signed by Cindie Crumbly, MD on 02/21/2024 Stage prefix: Initial diagnosis   11/08/2023 Pathology Results   Flow cytometry:  Consistent with CLL. Phenotype of Abnormal Cells: CD5, CD19, CD20, CD200, Kappa   FISH:  Positive for loss of one ATM signal and a loss of one 13q14 signal.  CCND1/IGH, chromosome 12, and TP53 were normal.    01/29/2024 Imaging   PSMA PET scan:  1. Progressive local recurrence of prostate carcinoma, as evidenced by increased right and developing left seminal vesicle tracer affinity. New or increased base to midgland diffuse tracer affinity. 2. Significant progression of adenopathy throughout the neck, chest, abdomen, and pelvis. Given distribution and lack of significant tracer affinity, strongly favored to represent lymphoma. Biggest LN is 3 cm. No bulky lymphadenopathy.    02/21/2024 Initial Diagnosis   CLL (chronic lymphocytic leukemia) (HCC)      INTERVAL HISTORY: Mason Aguilar 78 y.o. male following for CLL.  He is accompanied by by a friend who he lives with.  He reports no complaints today.  Patient denied any fever, chills, bloating or night sweats.  He has some weight loss but attributes it to not eating sweets recently.  Appetite is  good.  Patient recently had a PET scan that showed local recurrence of prostate cancer.  He is following with Dr. Dalene Seltzer for the same.  I have reviewed the past medical history, past surgical history, social history and family history with the patient   ALLERGIES:  has no known allergies.  MEDICATIONS:  Current Outpatient Medications  Medication Sig Dispense Refill   alendronate (FOSAMAX) 70 MG tablet Take 70 mg by mouth once a week.     aspirin EC 81  MG tablet Take 1 tablet (81 mg total) by mouth daily with breakfast. Swallow whole. 30 tablet 11   calcium acetate (PHOSLO) 667 MG capsule Take 667 mg by mouth 2 (two) times daily.     cetirizine (ZYRTEC) 10 MG tablet Take 10 mg by mouth daily.     Cholecalciferol (VITAMIN D-3) 25 MCG (1000 UT) CAPS Take 1 capsule by mouth daily.     finasteride (PROSCAR) 5 MG tablet Take 1 tablet (5 mg total) by mouth daily. 90 tablet 3   gabapentin (NEURONTIN) 300 MG capsule Take 1 capsule by mouth 3 (three) times daily.     losartan (COZAAR) 50 MG tablet Take 1 tablet by mouth daily.     omeprazole (PRILOSEC) 40 MG capsule Take 1 capsule by mouth daily.     oxyCODONE-acetaminophen (PERCOCET) 10-325 MG tablet Take 1 tablet by mouth 4 (four) times daily as needed for pain.     polyethylene glycol (MIRALAX / GLYCOLAX) 17 g packet Take 17 g by mouth daily. 30 each 4   simvastatin (ZOCOR) 40 MG tablet Take 40 mg by mouth at bedtime.     tamsulosin (FLOMAX) 0.4 MG CAPS capsule Take 1 capsule (0.4 mg total) by mouth daily. 90 capsule 3   tiZANidine (ZANAFLEX) 4 MG tablet Take 4 mg by mouth 2 (two) times daily.     No current facility-administered medications for this visit.     REVIEW OF SYSTEMS:   Constitutional: Denies fevers, chills or night sweats Eyes: Denies blurriness of vision Ears, nose, mouth, throat, and face: Denies mucositis or sore throat Respiratory: Denies cough, dyspnea or wheezes Cardiovascular: Denies palpitation, chest discomfort or lower extremity swelling Gastrointestinal:  Denies nausea, heartburn or change in bowel habits Skin: Denies abnormal skin rashes Lymphatics: Denies new lymphadenopathy or easy bruising Neurological:Denies numbness, tingling or new weaknesses Behavioral/Psych: Mood is stable, no new changes  All other systems were reviewed with the patient and are negative.  PHYSICAL EXAMINATION:   Vitals:   02/21/24 1356  BP: (!) 149/91  Pulse: 88  Resp: 20  Temp:  98.5 F (36.9 C)  SpO2: 98%    GENERAL:alert, no distress and comfortable SKIN: skin color, texture, turgor are normal, no rashes or significant lesions LYMPH:  no palpable lymphadenopathy in the cervical, axillary or inguinal LUNGS: clear to auscultation and percussion with normal breathing effort HEART: regular rate & rhythm and no murmurs and no lower extremity edema ABDOMEN:abdomen soft, non-tender and normal bowel sounds Musculoskeletal:no cyanosis of digits and no clubbing  NEURO: alert & oriented x 3 with fluent speech  LABORATORY DATA:  I have reviewed the data as listed  Lab Results  Component Value Date   WBC 28.0 (H) 02/08/2024   NEUTROABS 2.5 02/08/2024   HGB 13.5 02/08/2024   HCT 43.1 02/08/2024   MCV 86.5 02/08/2024   PLT 221 02/08/2024      Component Value Date/Time   NA 140 02/08/2024 1418   K 4.1 02/08/2024 1418  CL 109 02/08/2024 1418   CO2 23 02/08/2024 1418   GLUCOSE 98 02/08/2024 1418   BUN 11 02/08/2024 1418   CREATININE 0.88 02/08/2024 1418   CALCIUM 9.2 02/08/2024 1418   PROT 7.2 02/08/2024 1418   ALBUMIN 4.2 02/08/2024 1418   AST 30 02/08/2024 1418   ALT 23 02/08/2024 1418   ALKPHOS 83 02/08/2024 1418   BILITOT 0.7 02/08/2024 1418   GFRNONAA >60 02/08/2024 1418       Chemistry      Component Value Date/Time   NA 140 02/08/2024 1418   K 4.1 02/08/2024 1418   CL 109 02/08/2024 1418   CO2 23 02/08/2024 1418   BUN 11 02/08/2024 1418   CREATININE 0.88 02/08/2024 1418      Component Value Date/Time   CALCIUM 9.2 02/08/2024 1418   ALKPHOS 83 02/08/2024 1418   AST 30 02/08/2024 1418   ALT 23 02/08/2024 1418   BILITOT 0.7 02/08/2024 1418       RADIOGRAPHIC STUDIES: I have personally reviewed the radiological images as listed and agreed with the findings in the report. NM PET (PSMA) SKULL TO MID THIGH CLINICAL DATA:  Prostate cancer with radiation therapy and androgen deprivation therapy in 2015. Pelvic  adenopathy.  EXAM: NUCLEAR MEDICINE PET SKULL BASE TO THIGH  TECHNIQUE: 9.0 mCi Flotufolastat (Posluma) was injected intravenously. Full-ring PET imaging was performed from the skull base to thigh after the radiotracer. CT data was obtained and used for attenuation correction and anatomic localization.  COMPARISON:  11/10/2021 PET.  Abdominopelvic CT of 09/27/2022  FINDINGS: NECK  No radiotracer activity in neck lymph nodes.  Incidental CT finding: New or markedly progressive cervical adenopathy. Example left posterior triangle 1.5 cm node on 37/5, increased from 6 mm on the prior.  CHEST  Right paratracheal node measures 1.2 cm and a S.U.V. max of 1.9 on 65/4 versus 6 mm on the prior. Increased size and number of bilateral axillary nodes, without tracer affinity. No pulmonary parenchymal tracer affinity.  Incidental CT finding: Tiny hiatal hernia. Mild cardiomegaly. Aortic and coronary artery calcification. Tiny left lower lobe pulmonary nodules of up to 2-3 mm are similar to the prior and considered benign.  ABDOMEN/PELVIS  Prostate: Right seminal vesicle tracer affinity at a S.U.V. max of 8.1 today versus a S.U.V. max of 6.3 on the prior. There is likely minimal extension into the left seminal vesicle. Significant base to midgland tracer affinity at a S.U.V. max of 11.8 is new or increased. This is somewhat challenging to differentiate from adjacent urinary physiologic affinity.  Lymph nodes: Progression of abdominopelvic adenopathy, not significantly tracer avid. Example left external iliac node of 3.0 cm and a S.U.V. max of 1.6 on 178/4 versus 1.2 cm on the prior exam.  An index left periaortic node measures 1.3 cm and a S.U.V. max of 2.0 on 144/4 and is new or significantly increased from 2 mm on the prior.  Liver: No evidence of liver metastasis.  Incidental CT finding: Mild nonspecific caudate lobe enlargement. Normal adrenal glands. An upper pole left  renal 5.2 cm low-density lesion is likely a cyst . In the absence of clinically indicated signs/symptoms require(s) no independent follow-up. Abdominal aortic atherosclerosis. Developing ileocolic mesenteric adenopathy including on 152/4. Radiation seeds in the prostate.  SKELETON  No abnormal marrow activity.  IMPRESSION: 1. Progressive local recurrence, as evidenced by increased right and developing left seminal vesicle tracer affinity. New or increased base to midgland diffuse tracer affinity. 2. Significant progression of  adenopathy throughout the neck, chest, abdomen, and pelvis. Given distribution and lack of significant tracer affinity, strongly favored to represent lymphoma. 3. Incidental findings, including: Coronary artery atherosclerosis. Aortic Atherosclerosis (ICD10-I70.0). Tiny hiatal hernia.  Electronically Signed   By: Jeronimo Greaves M.D.   On: 02/12/2024 13:01

## 2024-02-21 NOTE — Assessment & Plan Note (Signed)
 Patient has a history of follow-up prostate cancer s/p XRT and ADT in 2015.  Recent PSA trending up and PET scan showed local recurrence.  Following up with Dr. Mena Goes for treatment. -Continue to follow with Dr. Mena Goes

## 2024-02-21 NOTE — Assessment & Plan Note (Addendum)
 Presence of leukocytosis with no anemia or thrombocytopenia.  Diffuse adenopathy present on PSMA PET scan but with low uptake/SUV.  No splenomegaly.  Patient has no B symptoms at this time.  RAI stage I. -Continue to monitor counts for now. -Patient does not meet criteria for treatment at this time.  Return to clinic in 3 months with labs.

## 2024-03-25 ENCOUNTER — Telehealth: Payer: Self-pay

## 2024-03-25 ENCOUNTER — Ambulatory Visit: Payer: Medicare (Managed Care) | Admitting: Urology

## 2024-03-25 NOTE — Telephone Encounter (Signed)
 Call Pt to reschedule appt left detailed vm of appointment time and date

## 2024-03-26 ENCOUNTER — Telehealth: Payer: Self-pay

## 2024-03-26 NOTE — Telephone Encounter (Signed)
 Called Pt niece answered made her aware of new appt for Pt see last telephone encounter for details

## 2024-03-26 NOTE — Telephone Encounter (Signed)
-----   Message from Jerilee Field sent at 03/26/2024  2:09 PM EDT ----- Let's do this - he needs a PSA this week then make him the 11:15 AM 4/7 and bump AK. AK needs a renal US before she sees me as she had hydronephrosis on last one. Reschedule AK so I can review her renal US. Thanks! ----- Message ----- From: Sarajane Jews, CMA Sent: 03/26/2024   9:43 AM EDT To: Jerilee Field, MD  Patient wants to be seen sooner for returned prostate cancer had previous appt 03/31 is it okay to double book 04/07 at 11:15AM ?

## 2024-04-01 ENCOUNTER — Ambulatory Visit (INDEPENDENT_AMBULATORY_CARE_PROVIDER_SITE_OTHER): Payer: Medicare (Managed Care) | Admitting: Urology

## 2024-04-01 VITALS — BP 130/76 | HR 78

## 2024-04-01 DIAGNOSIS — N401 Enlarged prostate with lower urinary tract symptoms: Secondary | ICD-10-CM

## 2024-04-01 DIAGNOSIS — C61 Malignant neoplasm of prostate: Secondary | ICD-10-CM | POA: Diagnosis not present

## 2024-04-01 DIAGNOSIS — N138 Other obstructive and reflux uropathy: Secondary | ICD-10-CM

## 2024-04-01 LAB — URINALYSIS, ROUTINE W REFLEX MICROSCOPIC
Bilirubin, UA: NEGATIVE
Glucose, UA: NEGATIVE
Ketones, UA: NEGATIVE
Leukocytes,UA: NEGATIVE
Nitrite, UA: NEGATIVE
Protein,UA: NEGATIVE
RBC, UA: NEGATIVE
Specific Gravity, UA: 1.015 (ref 1.005–1.030)
Urobilinogen, Ur: 0.2 mg/dL (ref 0.2–1.0)
pH, UA: 6.5 (ref 5.0–7.5)

## 2024-04-01 NOTE — Progress Notes (Addendum)
 04/01/2024 11:32 AM   Mason Aguilar June 17, 1946 [id number]  Referring provider: Narda Bacon, MD [address] [place],  [place] [address]  No chief complaint on file.   HPI:  F/u -     1) rising PSA p PCa treatment - he was tx with ADT and XRT in 2015 for a unfavorable intermediate risk prostate cancer diagnosed in October 2015 with pretreatment PSA of 5.8 and reported SV invasion on MRI with negative bone scan and prostate volume of 60 g. His PSA rose to 1.01 in 2021. His 2022 PSA was 1.6. Started 5ari for hematuria Nov 2022. His 2023 PSA stable at 1.6, but with left and right external iliac LAD.His May 2024 PSA stable at 1.6.   Saw Dr. Orvis Blare and has CLL under surveillance. No treatment needed at this time.    Primary treatment: 2015 ADT and EBXRT    Staging:  05/22 CT non-con - no LAD or bone lesions 07/22 MRI abd - benign  10/22 CT non-con - new 12 mm right ext iliac lymph node, no bone lesions  11/22 PSMA PET scan - focal right SV activity, no metastatic disease - I reviewed the images  10/23 CT a/p with - left and right external iliac LAD slight increase since 10/22 scan. No bone.    2)  gross hematuria- patient with gross hematuria with clots December 2021. Hematuria episode 10/22. Cystoscopy 10/22 benign. Friable prostate and bladder neck vessels. He is on finasteride  and tamsulosin . Imaging - as above.  Cysto benign again Nov 2023.    He returns in management of the above. Continues finasteride . No gross hematuria. September 2024 PSA 2.8 (5.6) - last PSA.  Current PSADT ~ 9 months. February 2025 PET scan-prostate only activity with prostate activity and left greater than right SV activity.  No metastatic disease.  Increased adenopathy of the neck, chest, abdomen and pelvis with lack of tracer suggest CLL.  Patient with a history of CLL but no B symptoms on surveillance.  Today, seen for the above.   He is here with his niece Kraig [place]. Curtis lives with her. Call her  with results - [phone].   PMH: Past Medical History:  Diagnosis Date   HOH (hard of hearing)    Hypercholesteremia    Hypertension    Prostate cancer [facility])     Surgical History: Past Surgical History:  Procedure Laterality Date   arm surgery     OPEN REDUCTION INTERNAL FIXATION (ORIF) METACARPAL Right 06/03/2021   Procedure: OPEN REDUCTION INTERNAL FIXATION (ORIF)RIGHT FIFTH  METACARPAL;  Surgeon: Lyanne Sample, MD;  Location: [facility];  Service: Orthopedics;  Laterality: Right;  AXILLARY BLOCK   polyps      Home Medications:  Allergies as of 04/01/2024   No Known Allergies      Medication List        Accurate as of April 01, 2024 11:32 AM. If you have any questions, ask your nurse or doctor.          alendronate 70 MG tablet Commonly known as: FOSAMAX Take 70 mg by mouth once a week.   aspirin  EC 81 MG tablet Take 1 tablet (81 mg total) by mouth daily with breakfast. Swallow whole.   calcium acetate 667 MG capsule Commonly known as: PHOSLO Take 667 mg by mouth 2 (two) times daily.   cetirizine 10 MG tablet Commonly known as: ZYRTEC Take 10 mg by mouth daily.   finasteride  5 MG tablet Commonly known  as: PROSCAR  Take 1 tablet (5 mg total) by mouth daily.   gabapentin  300 MG capsule Commonly known as: NEURONTIN  Take 1 capsule by mouth 3 (three) times daily.   losartan 50 MG tablet Commonly known as: COZAAR Take 1 tablet by mouth daily.   omeprazole 40 MG capsule Commonly known as: PRILOSEC Take 1 capsule by mouth daily.   oxyCODONE -acetaminophen  10-325 MG tablet Commonly known as: PERCOCET Take 1 tablet by mouth 4 (four) times daily as needed for pain.   polyethylene glycol 17 g packet Commonly known as: MIRALAX  / GLYCOLAX  Take 17 g by mouth daily.   simvastatin 40 MG tablet Commonly known as: ZOCOR Take 40 mg by mouth at bedtime.   tamsulosin  0.4 MG Caps capsule Commonly known as: FLOMAX  Take 1 capsule (0.4 mg total) by  mouth daily.   tiZANidine 4 MG tablet Commonly known as: ZANAFLEX Take 4 mg by mouth 2 (two) times daily.   Vitamin D-3 25 MCG (1000 UT) Caps Take 1 capsule by mouth daily.        Allergies: No Known Allergies  Family History: No family history on file.  Social History:  reports that he has never smoked. He has never used smokeless tobacco. He reports that he does not drink alcohol and does not use drugs.   Physical Exam: BP 130/76   Pulse 78   Constitutional:  Alert and oriented, No acute distress. HEENT: [place] AT, moist mucus membranes.  Trachea midline, no masses. Cardiovascular: No clubbing, cyanosis, or edema. Respiratory: Normal respiratory effort, no increased work of breathing. GI: Abdomen is soft, nontender, nondistended, no abdominal masses GU: No CVA tenderness Lymph: No cervical or inguinal lymphadenopathy. Skin: No rashes, bruises or suspicious lesions. Neurologic: Grossly intact, no focal deficits, moving all 4 extremities. Psychiatric: Normal mood and affect.  Laboratory Data: Lab Results  Component Value Date   WBC 28.0 (H) 02/08/2024   HGB 13.5 02/08/2024   HCT 43.1 02/08/2024   MCV 86.5 02/08/2024   PLT 221 02/08/2024    Lab Results  Component Value Date   CREATININE 0.88 02/08/2024    No results found for: "PSA"  Lab Results  Component Value Date   TESTOSTERONE  425 10/10/2022    No results found for: "HGBA1C"  Urinalysis    Component Value Date/Time   COLORURINE RED (A) 09/27/2022 1132   APPEARANCEUR Clear 11/27/2023 1327   LABSPEC  09/27/2022 1132    TEST NOT REPORTED DUE TO COLOR INTERFERENCE OF URINE PIGMENT   PHURINE  09/27/2022 1132    TEST NOT REPORTED DUE TO COLOR INTERFERENCE OF URINE PIGMENT   GLUCOSEU Negative 11/27/2023 1327   HGBUR (A) 09/27/2022 1132    TEST NOT REPORTED DUE TO COLOR INTERFERENCE OF URINE PIGMENT   BILIRUBINUR Negative 11/27/2023 1327   KETONESUR (A) 09/27/2022 1132    TEST NOT REPORTED DUE TO COLOR  INTERFERENCE OF URINE PIGMENT   PROTEINUR Negative 11/27/2023 1327   PROTEINUR (A) 09/27/2022 1132    TEST NOT REPORTED DUE TO COLOR INTERFERENCE OF URINE PIGMENT   UROBILINOGEN negative (A) 10/31/2022 1425   NITRITE Negative 11/27/2023 1327   NITRITE (A) 09/27/2022 1132    TEST NOT REPORTED DUE TO COLOR INTERFERENCE OF URINE PIGMENT   LEUKOCYTESUR Negative 11/27/2023 1327   LEUKOCYTESUR (A) 09/27/2022 1132    TEST NOT REPORTED DUE TO COLOR INTERFERENCE OF URINE PIGMENT    Lab Results  Component Value Date   LABMICR Comment 11/27/2023   WBCUA 0-5 10/10/2022  LABEPIT 0-10 10/10/2022   MUCUS Present 09/27/2021   BACTERIA Few (A) 10/10/2022    Pertinent Imaging: PET scan - 2025 - images reviewed   Results for orders placed during the hospital encounter of 09/26/21  CT Renal Stone Study  Narrative CLINICAL DATA:  Hematuria. History of prostate cancer. Right flank pain.  EXAM: CT ABDOMEN AND PELVIS WITHOUT CONTRAST  TECHNIQUE: Multidetector CT imaging of the abdomen and pelvis was performed following the standard protocol without IV contrast.  COMPARISON:  MRI abdomen 07/07/2021. CT chest abdomen and pelvis 05/09/2021.  FINDINGS: Lower chest: There is a stable 3 mm nodular density in the left lower lobe image 4/6. Visualized lung bases are otherwise clear.  Hepatobiliary: No focal liver abnormality is seen. Small gallstones are present. There is no biliary ductal dilatation.  Pancreas: Unremarkable. No pancreatic ductal dilatation or surrounding inflammatory changes.  Spleen: Normal in size without focal abnormality.  Adrenals/Urinary Tract: There is no evidence for urinary tract calculus or hydronephrosis. There is a 6.1 cm left renal cyst which is similar to the prior study. Bilateral adrenal glands are within normal limits. There is heterogeneous hyperdensity within the posterior bladder measured 2.1 x 2.3 by 3.0 cm. This has ill-defined borders. The bladder  is otherwise within normal limits.  Stomach/Bowel: There is a small hiatal hernia. Stomach is otherwise within normal limits. Appendix appears normal. No evidence of bowel wall thickening, distention, or inflammatory changes. There is sigmoid colon diverticulosis without evidence for acute diverticulitis.  Vascular/Lymphatic: Aortic atherosclerosis. No evidence for aneurysm. There are mildly enlarged right external iliac lymph nodes measuring up to 12 mm, unchanged from prior.  Reproductive: Prostate radiotherapy seeds are again noted.  Other: There are small fat containing bilateral inguinal hernias. There is no free fluid or free air.  Musculoskeletal: Degenerative changes affect the spine and hips. There is stable mild compression deformity of T11.  IMPRESSION: 1. Heterogeneous hyperdensity in the posterior bladder worrisome for hemorrhage. Cannot exclude underlying bladder lesion. 2. No hydronephrosis or urinary tract calculus. 3. Colonic diverticulosis without evidence for diverticulitis. 4. Stable mildly enlarged external iliac chain lymph nodes. 5.  Aortic Atherosclerosis (ICD10-I70.0).   Electronically Signed By: Tyron Gallon M.D. On: 09/26/2021 23:28   Assessment & Plan:    1. BPH with obstruction/lower urinary tract symptoms (Primary) Cont finasteride   - Urinalysis, Routine w reflex microscopic  2. PCa - Check PSA. Discussed nature r/b/a to Xtandi. He's not a good candidate for local salvage therapy due to SV involvement. No NG risk. PSA sent - start Xtandi or consider Eligard.   No follow-ups on file.  Christina Coyer, MD  [facility]  [address] [place], [place] [address] [phone]

## 2024-04-02 ENCOUNTER — Telehealth: Payer: Self-pay

## 2024-04-02 LAB — PSA: Prostate Specific Ag, Serum: 2.8 ng/mL (ref 0.0–4.0)

## 2024-04-02 NOTE — Telephone Encounter (Signed)
-----   Message from Jerilee Field sent at 04/02/2024  4:24 PM EDT ----- Let Mason Aguilar know his PSA looks good - remained exactly the same, so there is no sign the prostate cancer is growing quickly. We will continue to monitor. ----- Message ----- From: Sarajane Jews, CMA Sent: 04/02/2024   8:26 AM EDT To: Jerilee Field, MD  Please review.

## 2024-04-02 NOTE — Telephone Encounter (Signed)
 Called Pt. Niece serena answered and I relayed PSA results to her per MD [place] see previous telephone encounter

## 2024-04-29 ENCOUNTER — Ambulatory Visit: Payer: Medicare (Managed Care) | Admitting: Urology

## 2024-05-15 ENCOUNTER — Inpatient Hospital Stay: Payer: Medicare (Managed Care) | Attending: Oncology

## 2024-05-15 DIAGNOSIS — C61 Malignant neoplasm of prostate: Secondary | ICD-10-CM | POA: Diagnosis not present

## 2024-05-15 DIAGNOSIS — Z923 Personal history of irradiation: Secondary | ICD-10-CM | POA: Diagnosis not present

## 2024-05-15 DIAGNOSIS — C9110 Chronic lymphocytic leukemia of B-cell type not having achieved remission: Secondary | ICD-10-CM | POA: Diagnosis present

## 2024-05-15 DIAGNOSIS — R9721 Rising PSA following treatment for malignant neoplasm of prostate: Secondary | ICD-10-CM | POA: Diagnosis not present

## 2024-05-15 LAB — CBC WITH DIFFERENTIAL/PLATELET
Abs Immature Granulocytes: 0 10*3/uL (ref 0.00–0.07)
Basophils Absolute: 0.4 10*3/uL — ABNORMAL HIGH (ref 0.0–0.1)
Basophils Relative: 1 %
Eosinophils Absolute: 0 10*3/uL (ref 0.0–0.5)
Eosinophils Relative: 0 %
HCT: 43.6 % (ref 39.0–52.0)
Hemoglobin: 13.6 g/dL (ref 13.0–17.0)
Lymphocytes Relative: 91 %
Lymphs Abs: 36.5 10*3/uL — ABNORMAL HIGH (ref 0.7–4.0)
MCH: 26.9 pg (ref 26.0–34.0)
MCHC: 31.2 g/dL (ref 30.0–36.0)
MCV: 86.2 fL (ref 80.0–100.0)
Monocytes Absolute: 1.2 10*3/uL — ABNORMAL HIGH (ref 0.1–1.0)
Monocytes Relative: 3 %
Neutro Abs: 2 10*3/uL (ref 1.7–7.7)
Neutrophils Relative %: 5 %
Platelets: 200 10*3/uL (ref 150–400)
RBC: 5.06 MIL/uL (ref 4.22–5.81)
RDW: 13.4 % (ref 11.5–15.5)
WBC: 40.1 10*3/uL — ABNORMAL HIGH (ref 4.0–10.5)
nRBC: 0 % (ref 0.0–0.2)

## 2024-05-15 LAB — COMPREHENSIVE METABOLIC PANEL WITH GFR
ALT: 14 U/L (ref 0–44)
AST: 23 U/L (ref 15–41)
Albumin: 4.2 g/dL (ref 3.5–5.0)
Alkaline Phosphatase: 69 U/L (ref 38–126)
Anion gap: 7 (ref 5–15)
BUN: 12 mg/dL (ref 8–23)
CO2: 23 mmol/L (ref 22–32)
Calcium: 9.1 mg/dL (ref 8.9–10.3)
Chloride: 105 mmol/L (ref 98–111)
Creatinine, Ser: 1.16 mg/dL (ref 0.61–1.24)
GFR, Estimated: >60 mL/min (ref >60)
Glucose, Bld: 91 mg/dL (ref 70–99)
Potassium: 4 mmol/L (ref 3.5–5.1)
Sodium: 135 mmol/L (ref 135–145)
Total Bilirubin: 0.6 mg/dL (ref 0.0–1.2)
Total Protein: 7.1 g/dL (ref 6.5–8.1)

## 2024-05-15 LAB — LACTATE DEHYDROGENASE: LDH: 136 U/L (ref 98–192)

## 2024-05-15 LAB — URIC ACID: Uric Acid, Serum: 6.1 mg/dL (ref 3.7–8.6)

## 2024-05-22 ENCOUNTER — Inpatient Hospital Stay: Payer: Medicare (Managed Care) | Admitting: Oncology

## 2024-05-27 ENCOUNTER — Ambulatory Visit: Payer: Medicare (Managed Care) | Admitting: Urology

## 2024-05-29 ENCOUNTER — Inpatient Hospital Stay: Payer: Medicare (Managed Care) | Admitting: Oncology

## 2024-06-05 ENCOUNTER — Inpatient Hospital Stay: Payer: Medicare (Managed Care) | Attending: Oncology | Admitting: Oncology

## 2024-06-05 VITALS — BP 124/83 | HR 92 | Temp 98.9°F | Resp 20 | Wt 240.2 lb

## 2024-06-05 DIAGNOSIS — R590 Localized enlarged lymph nodes: Secondary | ICD-10-CM | POA: Insufficient documentation

## 2024-06-05 DIAGNOSIS — C9110 Chronic lymphocytic leukemia of B-cell type not having achieved remission: Secondary | ICD-10-CM | POA: Insufficient documentation

## 2024-06-05 DIAGNOSIS — C61 Malignant neoplasm of prostate: Secondary | ICD-10-CM | POA: Diagnosis not present

## 2024-06-05 DIAGNOSIS — Z923 Personal history of irradiation: Secondary | ICD-10-CM | POA: Diagnosis not present

## 2024-06-05 NOTE — Progress Notes (Signed)
 [facility] at [facility] HEMATOLOGY FOLLOW-UP VISIT  Mason Bacon, MD  REASON FOR FOLLOW-UP: CLL  ASSESSMENT & PLAN:  Patient is a 78 year old male with past medical history of prostate cancer and CLL   CLL (chronic lymphocytic leukemia) (HCC) Presence of leukocytosis with no anemia or thrombocytopenia.  Diffuse adenopathy present on PSMA PET scan but with low uptake/SUV.  No splenomegaly.  Patient has no B symptoms at this time.  RAI stage I. Patient currently has palpable lymphadenopathy in his left cervical lymph node Lymphocytes trending up with currently less than 50% increase over the past 3 months  -Will obtain a CT CAP and neck to rule out massive splenomegaly or progressive/massive lymphadenopathy -Patient does not meet criteria for treatment at this time.  Return to clinic in 3 months with labs.  Malignant neoplasm of prostate [facility]) Patient has a history of follow-up prostate cancer s/p XRT and ADT in 2015.  Recent PSA trending up and PET scan showed local recurrence.   Following up with Dr. Derrick Fling for treatment.  -Continue to follow with Dr. Derrick Fling    Orders Placed This Encounter  Procedures   CT CHEST ABDOMEN PELVIS W CONTRAST    Standing Status:   Future    Expected Date:   06/05/2024    Expiration Date:   06/05/2025    If indicated for the ordered procedure, I authorize the administration of contrast media per Radiology protocol:   Yes    Does the patient have a contrast media/X-ray dye allergy?:   No    Preferred imaging location?:   [facility]    If indicated for the ordered procedure, I authorize the administration of oral contrast media per Radiology protocol:   Yes   CT SOFT TISSUE NECK W CONTRAST    Standing Status:   Future    Expected Date:   06/05/2024    Expiration Date:   06/05/2025    If indicated for the ordered procedure, I authorize the administration of contrast media per Radiology protocol:   Yes    Does the  patient have a contrast media/X-ray dye allergy?:   No    Preferred imaging location?:   [facility]    The total time spent in the appointment was 20 minutes encounter with patients including review of chart and various tests results, discussions about plan of care and coordination of care plan   All questions were answered. The patient knows to call the clinic with any problems, questions or concerns. No barriers to learning was detected.  Eduardo Grade, MD 6/11/20251:16 PM    Oncology History  CLL (chronic lymphocytic leukemia) (HCC)  11/08/2023 Cancer Staging   Staging form: Chronic Lymphocytic Leukemia / Small Lymphocytic Lymphoma, AJCC 8th Edition - Clinical stage from 11/08/2023: Modified Rai Stage I (Modified Rai risk: Intermediate, Binet: Stage B, Lymphocytosis: Present, Adenopathy: Present, Organomegaly: Absent, Anemia: Absent, Thrombocytopenia: Absent) - Signed by Eduardo Grade, MD on 02/21/2024 Stage prefix: Initial diagnosis   11/08/2023 Pathology Results   Flow cytometry:  Consistent with CLL. Phenotype of Abnormal Cells: CD5, CD19, CD20, CD200, Kappa   FISH:  Positive for loss of one ATM signal and a loss of one 13q14 signal.  CCND1/IGH, chromosome 12, and TP53 were normal.    01/29/2024 Imaging   PSMA PET scan:  1. Progressive local recurrence of prostate carcinoma, as evidenced by increased right and developing left seminal vesicle tracer affinity. New or increased base to midgland diffuse tracer  affinity. 2. Significant progression of adenopathy throughout the neck, chest, abdomen, and pelvis. Given distribution and lack of significant tracer affinity, strongly favored to represent lymphoma. Biggest LN is 3 cm. No bulky lymphadenopathy.    02/21/2024 Initial Diagnosis   CLL (chronic lymphocytic leukemia) (HCC)      INTERVAL HISTORY: Mason Aguilar 78 y.o. male following for CLL.  He is accompanied by by a friend who he lives with.  He reports no  complaints today.  Patient denied any fever, chills, bloating or night sweats.  Appetite is good.  Weight is stable.  I have reviewed the past medical history, past surgical history, social history and family history with the patient   ALLERGIES:  has no known allergies.  MEDICATIONS:  Current Outpatient Medications  Medication Sig Dispense Refill   alendronate (FOSAMAX) 70 MG tablet Take 70 mg by mouth once a week.     aspirin  EC 81 MG tablet Take 1 tablet (81 mg total) by mouth daily with breakfast. Swallow whole. 30 tablet 11   calcium acetate (PHOSLO) 667 MG tablet Take 667 mg by mouth 2 (two) times daily.     cetirizine (ZYRTEC) 10 MG tablet Take 10 mg by mouth daily.     Cholecalciferol (VITAMIN D-3) 25 MCG (1000 UT) CAPS Take 1 capsule by mouth daily.     finasteride  (PROSCAR ) 5 MG tablet Take 1 tablet (5 mg total) by mouth daily. 90 tablet 3   gabapentin  (NEURONTIN ) 300 MG capsule Take 1 capsule by mouth 3 (three) times daily.     losartan (COZAAR) 50 MG tablet Take 1 tablet by mouth daily.     omeprazole (PRILOSEC) 40 MG capsule Take 1 capsule by mouth daily.     oxyCODONE -acetaminophen  (PERCOCET) 10-325 MG tablet Take 1 tablet by mouth 4 (four) times daily as needed for pain.     polyethylene glycol (MIRALAX  / GLYCOLAX ) 17 g packet Take 17 g by mouth daily. 30 each 4   simvastatin (ZOCOR) 40 MG tablet Take 40 mg by mouth at bedtime.     tamsulosin  (FLOMAX ) 0.4 MG CAPS capsule Take 1 capsule (0.4 mg total) by mouth daily. 90 capsule 3   tiZANidine (ZANAFLEX) 4 MG tablet Take 4 mg by mouth 2 (two) times daily.     No current facility-administered medications for this visit.     REVIEW OF SYSTEMS:   Constitutional: Denies fevers, chills or night sweats Eyes: Denies blurriness of vision Ears, nose, mouth, throat, and face: Denies mucositis or sore throat Respiratory: Denies cough, dyspnea or wheezes Cardiovascular: Denies palpitation, chest discomfort or lower extremity  swelling Gastrointestinal:  Denies nausea, heartburn or change in bowel habits Skin: Denies abnormal skin rashes Lymphatics: Denies new lymphadenopathy or easy bruising Neurological:Denies numbness, tingling or new weaknesses Behavioral/Psych: Mood is stable, no new changes  All other systems were reviewed with the patient and are negative.  PHYSICAL EXAMINATION:   Vitals:   06/05/24 0855 06/05/24 0900  BP: (!) 144/87 124/83  Pulse: 92   Resp: 20   Temp: 98.9 F (37.2 C)   SpO2: 94%     GENERAL:alert, no distress and comfortable SKIN: skin color, texture, turgor are normal, no rashes or significant lesions LYMPH: Palpable 5 cm into 4 cm cervical lymphadenopathy in the left level 3 and 4, no palpable lymphadenopathy in the right cervical, axillary or inguinal LUNGS: clear to auscultation and percussion with normal breathing effort HEART: regular rate & rhythm and no murmurs and no lower extremity edema  ABDOMEN:abdomen soft, non-tender and normal bowel sounds Musculoskeletal:no cyanosis of digits and no clubbing  NEURO: alert & oriented x 3 with fluent speech  LABORATORY DATA:  I have reviewed the data as listed  Lab Results  Component Value Date   WBC 40.1 (H) 05/15/2024   NEUTROABS 2.0 05/15/2024   HGB 13.6 05/15/2024   HCT 43.6 05/15/2024   MCV 86.2 05/15/2024   PLT 200 05/15/2024      Component Value Date/Time   NA 135 05/15/2024 1341   K 4.0 05/15/2024 1341   CL 105 05/15/2024 1341   CO2 23 05/15/2024 1341   GLUCOSE 91 05/15/2024 1341   BUN 12 05/15/2024 1341   CREATININE 1.16 05/15/2024 1341   CALCIUM 9.1 05/15/2024 1341   PROT 7.1 05/15/2024 1341   ALBUMIN 4.2 05/15/2024 1341   AST 23 05/15/2024 1341   ALT 14 05/15/2024 1341   ALKPHOS 69 05/15/2024 1341   BILITOT 0.6 05/15/2024 1341   GFRNONAA >60 05/15/2024 1341       Chemistry      Component Value Date/Time   NA 135 05/15/2024 1341   K 4.0 05/15/2024 1341   CL 105 05/15/2024 1341   CO2 23  05/15/2024 1341   BUN 12 05/15/2024 1341   CREATININE 1.16 05/15/2024 1341      Component Value Date/Time   CALCIUM 9.1 05/15/2024 1341   ALKPHOS 69 05/15/2024 1341   AST 23 05/15/2024 1341   ALT 14 05/15/2024 1341   BILITOT 0.6 05/15/2024 1341      Latest Reference Range & Units 05/15/24 13:41  LDH 98 - 192 U/L 136     RADIOGRAPHIC STUDIES: I have personally reviewed the radiological images as listed and agreed with the findings in the report.  None new to review

## 2024-06-05 NOTE — Assessment & Plan Note (Signed)
 Patient has a history of follow-up prostate cancer s/p XRT and ADT in 2015.  Recent PSA trending up and PET scan showed local recurrence.  Following up with Dr. Mena Goes for treatment. -Continue to follow with Dr. Mena Goes

## 2024-06-05 NOTE — Assessment & Plan Note (Signed)
 Presence of leukocytosis with no anemia or thrombocytopenia.  Diffuse adenopathy present on PSMA PET scan but with low uptake/SUV.  No splenomegaly.  Patient has no B symptoms at this time.  RAI stage I. Patient currently has palpable lymphadenopathy in his left cervical lymph node Lymphocytes trending up with currently less than 50% increase over the past 3 months  -Will obtain a CT CAP and neck to rule out massive splenomegaly or progressive/massive lymphadenopathy -Patient does not meet criteria for treatment at this time.  Return to clinic in 3 months with labs.

## 2024-06-26 ENCOUNTER — Ambulatory Visit (HOSPITAL_COMMUNITY)
Admission: RE | Admit: 2024-06-26 | Discharge: 2024-06-26 | Disposition: A | Discharge status: Home / Self Care | Payer: Medicare (Managed Care) | Source: Ambulatory Visit | Attending: Oncology | Admitting: Oncology

## 2024-06-26 DIAGNOSIS — C9110 Chronic lymphocytic leukemia of B-cell type not having achieved remission: Secondary | ICD-10-CM | POA: Diagnosis present

## 2024-06-26 MED ORDER — IOHEXOL 300 MG/ML  SOLN
135.0000 mL | Freq: Once | INTRAMUSCULAR | Status: AC | PRN
Start: 2024-06-26 — End: 2024-06-26
  Administered 2024-06-26: 135 mL via INTRAVENOUS

## 2024-07-01 ENCOUNTER — Ambulatory Visit: Payer: Medicare (Managed Care) | Admitting: Urology

## 2024-07-01 ENCOUNTER — Encounter: Payer: Self-pay | Admitting: Urology

## 2024-07-01 VITALS — BP 133/82 | HR 88

## 2024-07-01 DIAGNOSIS — N401 Enlarged prostate with lower urinary tract symptoms: Secondary | ICD-10-CM | POA: Diagnosis not present

## 2024-07-01 DIAGNOSIS — R9721 Rising PSA following treatment for malignant neoplasm of prostate: Secondary | ICD-10-CM | POA: Diagnosis not present

## 2024-07-01 LAB — URINALYSIS, ROUTINE W REFLEX MICROSCOPIC
Bilirubin, UA: NEGATIVE
Glucose, UA: NEGATIVE
Ketones, UA: NEGATIVE
Leukocytes,UA: NEGATIVE
Nitrite, UA: NEGATIVE
Protein,UA: NEGATIVE
RBC, UA: NEGATIVE
Specific Gravity, UA: 1.01 (ref 1.005–1.030)
Urobilinogen, Ur: 0.2 mg/dL (ref 0.2–1.0)
pH, UA: 7 (ref 5.0–7.5)

## 2024-07-01 NOTE — Progress Notes (Signed)
 07/01/2024 2:43 PM   Dannielle Angle 1946-08-06 [id number]  Referring provider: Austin Mutton, MD [address] [place],  [place] [address]  No chief complaint on file.   HPI:  F/u -     1) rising PSA p PCa treatment - he was tx with ADT and XRT in 2015 for a unfavorable intermediate risk prostate cancer diagnosed in October 2015 with pretreatment PSA of 5.8 and reported SV invasion on MRI with negative bone scan and prostate volume of 60 g. His PSA rose to 1.01 in 2021. His 2022 PSA was 1.6. Started 5ari for hematuria Nov 2022. His 2023 PSA stable at 1.6, but with left and right external iliac LAD.His May 2024 PSA stable at 1.6. September 2024 PSA 2.8 (5.6). Current PSADT ~ 9 months. February 2025 PET scan-prostate only activity with prostate activity and left greater than right SV activity.  No metastatic disease.  Increased adenopathy of the neck, chest, abdomen and pelvis with lack of tracer suggest CLL.  Patient with a history of CLL but no B symptoms on surveillance.   Saw Dr. Davonna and has CLL under surveillance. No treatment needed at this time.    Primary treatment: 2015 ADT and EBXRT   Current Tx: NONE    Staging:  05/22 CT non-con - no LAD or bone lesions 07/22 MRI abd - benign  10/22 CT non-con - new 12 mm right ext iliac lymph node, no bone lesions  11/22 PSMA PET scan - focal right SV activity, no metastatic disease - I reviewed the images  10/23 CT a/p with - left and right external iliac LAD slight increase since 10/22 scan. No bone.    2)  gross hematuria- patient with gross hematuria with clots December 2021. Hematuria episode 10/22. Cystoscopy 10/22 benign. Friable prostate and bladder neck vessels. He is on finasteride  and tamsulosin . Imaging - as above.  Cysto benign again Nov 2023. Continues finasteride . No gross hematuria.   Today, seen for the above. Apr 2025 PSA 2.8 (5.6) -  Stable. May 2025 Cr 1.6. Jul 2025 CT C/A/P - inc LAD from CLL. No bone lesions.      He is here with his niece [name]. Fern lives with her. Call her with results - [phone].    PMH: Past Medical History:  Diagnosis Date   HOH (hard of hearing)    Hypercholesteremia    Hypertension    Prostate cancer [facility])     Surgical History: Past Surgical History:  Procedure Laterality Date   arm surgery     OPEN REDUCTION INTERNAL FIXATION (ORIF) METACARPAL Right 06/03/2021   Procedure: OPEN REDUCTION INTERNAL FIXATION (ORIF)RIGHT FIFTH  METACARPAL;  Surgeon: Murrell Kuba, MD;  Location: [facility];  Service: Orthopedics;  Laterality: Right;  AXILLARY BLOCK   polyps      Home Medications:  Allergies as of 07/01/2024   No Known Allergies      Medication List        Accurate as of July 01, 2024  2:43 PM. If you have any questions, ask your nurse or doctor.          alendronate 70 MG tablet Commonly known as: FOSAMAX Take 70 mg by mouth once a week.   aspirin  EC 81 MG tablet Take 1 tablet (81 mg total) by mouth daily with breakfast. Swallow whole.   calcium acetate 667 MG tablet Commonly known as: PHOSLO Take 667 mg by mouth 2 (two) times daily.   cetirizine 10  MG tablet Commonly known as: ZYRTEC Take 10 mg by mouth daily.   finasteride  5 MG tablet Commonly known as: PROSCAR  Take 1 tablet (5 mg total) by mouth daily.   gabapentin  300 MG capsule Commonly known as: NEURONTIN  Take 1 capsule by mouth 3 (three) times daily.   losartan 50 MG tablet Commonly known as: COZAAR Take 1 tablet by mouth daily.   omeprazole 40 MG capsule Commonly known as: PRILOSEC Take 1 capsule by mouth daily.   oxyCODONE -acetaminophen  10-325 MG tablet Commonly known as: PERCOCET Take 1 tablet by mouth 4 (four) times daily as needed for pain.   polyethylene glycol 17 g packet Commonly known as: MIRALAX  / GLYCOLAX  Take 17 g by mouth daily.   simvastatin 40 MG tablet Commonly known as: ZOCOR Take 40 mg by mouth at bedtime.   tamsulosin  0.4 MG  Caps capsule Commonly known as: FLOMAX  Take 1 capsule (0.4 mg total) by mouth daily.   tiZANidine 4 MG tablet Commonly known as: ZANAFLEX Take 4 mg by mouth 2 (two) times daily.   Vitamin D-3 25 MCG (1000 UT) Caps Take 1 capsule by mouth daily.        Allergies: No Known Allergies  Family History: No family history on file.  Social History:  reports that he has never smoked. He has never used smokeless tobacco. He reports that he does not drink alcohol and does not use drugs.   Physical Exam: There were no vitals taken for this visit.  Constitutional:  Alert and oriented, No acute distress. HEENT: [place] AT, moist mucus membranes.  Trachea midline, no masses. Cardiovascular: No clubbing, cyanosis, or edema. Respiratory: Normal respiratory effort, no increased work of breathing. GI: Abdomen is soft, nontender, nondistended, no abdominal masses GU: No CVA tenderness Skin: No rashes, bruises or suspicious lesions. Neurologic: Grossly intact, no focal deficits, moving all 4 extremities. Psychiatric: Normal mood and affect.  Laboratory Data: Lab Results  Component Value Date   WBC 40.1 (H) 05/15/2024   HGB 13.6 05/15/2024   HCT 43.6 05/15/2024   MCV 86.2 05/15/2024   PLT 200 05/15/2024    Lab Results  Component Value Date   CREATININE 1.16 05/15/2024    No results found for: PSA  Lab Results  Component Value Date   TESTOSTERONE  425 10/10/2022    No results found for: HGBA1C  Urinalysis    Component Value Date/Time   COLORURINE RED (A) 09/27/2022 1132   APPEARANCEUR Clear 04/01/2024 1127   LABSPEC  09/27/2022 1132    TEST NOT REPORTED DUE TO COLOR INTERFERENCE OF URINE PIGMENT   PHURINE  09/27/2022 1132    TEST NOT REPORTED DUE TO COLOR INTERFERENCE OF URINE PIGMENT   GLUCOSEU Negative 04/01/2024 1127   HGBUR (A) 09/27/2022 1132    TEST NOT REPORTED DUE TO COLOR INTERFERENCE OF URINE PIGMENT   BILIRUBINUR Negative 04/01/2024 1127   KETONESUR (A)  09/27/2022 1132    TEST NOT REPORTED DUE TO COLOR INTERFERENCE OF URINE PIGMENT   PROTEINUR Negative 04/01/2024 1127   PROTEINUR (A) 09/27/2022 1132    TEST NOT REPORTED DUE TO COLOR INTERFERENCE OF URINE PIGMENT   UROBILINOGEN negative (A) 10/31/2022 1425   NITRITE Negative 04/01/2024 1127   NITRITE (A) 09/27/2022 1132    TEST NOT REPORTED DUE TO COLOR INTERFERENCE OF URINE PIGMENT   LEUKOCYTESUR Negative 04/01/2024 1127   LEUKOCYTESUR (A) 09/27/2022 1132    TEST NOT REPORTED DUE TO COLOR INTERFERENCE OF URINE PIGMENT    Lab Results  Component Value Date   LABMICR Comment 04/01/2024   WBCUA 0-5 10/10/2022   LABEPIT 0-10 10/10/2022   MUCUS Present 09/27/2021   BACTERIA Few (A) 10/10/2022    Pertinent Imaging:  PET scan and CT scan - 2025 - images review.   Results for orders placed during the hospital encounter of 09/26/21  CT Renal Stone Study IMPRESSION: 1. Heterogeneous hyperdensity in the posterior bladder worrisome for hemorrhage. Cannot exclude underlying bladder lesion. 2. No hydronephrosis or urinary tract calculus. 3. Colonic diverticulosis without evidence for diverticulitis. 4. Stable mildly enlarged external iliac chain lymph nodes. 5.  Aortic Atherosclerosis (ICD10-I70.0).   Electronically Signed By: Greig Pique M.D. On: 09/26/2021 23:28   Assessment & Plan:    1) rising PSA p PCa tx - imaging continues to show no metastatic disease from PCa. PSA was stable Dec 2024  -- Apr 2025. Again disc primary PCa treatment such as cryo or RARP. Cont surveillance.    2) BPH - cont finasteride .   No follow-ups on file.  Donnice Brooks, MD  [facility]  [address] [place], [place] [address] [phone]

## 2024-09-04 ENCOUNTER — Other Ambulatory Visit: Payer: Self-pay

## 2024-09-04 DIAGNOSIS — C9110 Chronic lymphocytic leukemia of B-cell type not having achieved remission: Secondary | ICD-10-CM

## 2024-09-05 ENCOUNTER — Inpatient Hospital Stay: Payer: Medicare (Managed Care) | Attending: Oncology

## 2024-09-05 DIAGNOSIS — R590 Localized enlarged lymph nodes: Secondary | ICD-10-CM | POA: Insufficient documentation

## 2024-09-05 DIAGNOSIS — Z8546 Personal history of malignant neoplasm of prostate: Secondary | ICD-10-CM | POA: Diagnosis not present

## 2024-09-05 DIAGNOSIS — Z9229 Personal history of other drug therapy: Secondary | ICD-10-CM | POA: Diagnosis not present

## 2024-09-05 DIAGNOSIS — Z923 Personal history of irradiation: Secondary | ICD-10-CM | POA: Insufficient documentation

## 2024-09-05 DIAGNOSIS — C9110 Chronic lymphocytic leukemia of B-cell type not having achieved remission: Secondary | ICD-10-CM | POA: Diagnosis present

## 2024-09-05 LAB — URIC ACID: Uric Acid, Serum: 7.6 mg/dL (ref 3.7–8.6)

## 2024-09-05 LAB — CBC WITH DIFFERENTIAL/PLATELET
Abs Immature Granulocytes: 0.08 K/uL — ABNORMAL HIGH (ref 0.00–0.07)
Basophils Absolute: 0.1 K/uL (ref 0.0–0.1)
Basophils Relative: 0 %
Eosinophils Absolute: 0.1 K/uL (ref 0.0–0.5)
Eosinophils Relative: 0 %
HCT: 42.7 % (ref 39.0–52.0)
Hemoglobin: 13.5 g/dL (ref 13.0–17.0)
Immature Granulocytes: 0 %
Lymphocytes Relative: 89 %
Lymphs Abs: 40.9 K/uL — ABNORMAL HIGH (ref 0.7–4.0)
MCH: 27.6 pg (ref 26.0–34.0)
MCHC: 31.6 g/dL (ref 30.0–36.0)
MCV: 87.1 fL (ref 80.0–100.0)
Monocytes Absolute: 0.7 K/uL (ref 0.1–1.0)
Monocytes Relative: 2 %
Neutro Abs: 3.9 K/uL (ref 1.7–7.7)
Neutrophils Relative %: 9 %
Platelets: 179 K/uL (ref 150–400)
RBC: 4.9 MIL/uL (ref 4.22–5.81)
RDW: 13.5 % (ref 11.5–15.5)
Smear Review: NORMAL
WBC: 45.8 K/uL — ABNORMAL HIGH (ref 4.0–10.5)
nRBC: 0 % (ref 0.0–0.2)

## 2024-09-05 LAB — LACTATE DEHYDROGENASE: LDH: 141 U/L (ref 98–192)

## 2024-09-05 LAB — COMPREHENSIVE METABOLIC PANEL WITH GFR
ALT: 20 U/L (ref 0–44)
AST: 33 U/L (ref 15–41)
Albumin: 4.1 g/dL (ref 3.5–5.0)
Alkaline Phosphatase: 59 U/L (ref 38–126)
Anion gap: 14 (ref 5–15)
BUN: 14 mg/dL (ref 8–23)
CO2: 22 mmol/L (ref 22–32)
Calcium: 8.9 mg/dL (ref 8.9–10.3)
Chloride: 104 mmol/L (ref 98–111)
Creatinine, Ser: 1.11 mg/dL (ref 0.61–1.24)
GFR, Estimated: >60 mL/min
Glucose, Bld: 128 mg/dL — ABNORMAL HIGH (ref 70–99)
Potassium: 4.2 mmol/L (ref 3.5–5.1)
Sodium: 140 mmol/L (ref 135–145)
Total Bilirubin: 1.1 mg/dL (ref 0.0–1.2)
Total Protein: 7.1 g/dL (ref 6.5–8.1)

## 2024-09-12 ENCOUNTER — Inpatient Hospital Stay (HOSPITAL_BASED_OUTPATIENT_CLINIC_OR_DEPARTMENT_OTHER): Payer: Medicare (Managed Care) | Admitting: Oncology

## 2024-09-12 VITALS — BP 126/79 | HR 89 | Temp 97.9°F | Resp 18 | Wt 241.6 lb

## 2024-09-12 DIAGNOSIS — C9110 Chronic lymphocytic leukemia of B-cell type not having achieved remission: Secondary | ICD-10-CM

## 2024-09-12 DIAGNOSIS — C61 Malignant neoplasm of prostate: Secondary | ICD-10-CM | POA: Diagnosis not present

## 2024-09-12 NOTE — Progress Notes (Signed)
 [facility] at [facility] HEMATOLOGY FOLLOW-UP VISIT  Mason Mutton, MD  REASON FOR FOLLOW-UP: CLL  ASSESSMENT & PLAN:  Patient is a 78 y.o. male with past medical history of prostate cancer and CLL   Assessment & Plan CLL (chronic lymphocytic leukemia) (HCC) Presence of leukocytosis with no anemia or thrombocytopenia.   Diffuse adenopathy present on PSMA PET scan but with low uptake/SUV.   No splenomegaly.  Patient has no B symptoms at this time.  RAI stage I (lymphadenopathy) Patient currently has palpable lymphadenopathy in his left cervical lymph node Lymphocytes trending up with currently less than 50% increase over the past 3 months  -We reviewed the CT scans together.  Patient has diffuse lymphadenopathy but nothing more than 8 cm.  No splenomegaly.  No hepatomegaly. - Labs reviewed today: CMP: WNL, LDH: Normal, CBC: WBC: 45.8, ALC: 40.9, hemoglobin: 13.5, platelets: 179 -Patient does not meet criteria for treatment at this time. - Patient denies B symptoms today - If patient develops B symptoms or if there is significant increase in lymphocytes or is thrombocytopenic or anemic, will consider starting treatment at that time  Return to clinic in 3 months with labs. Malignant neoplasm of prostate [facility]) Patient has a history of follow-up prostate cancer s/p XRT and ADT in 2015.  Recent PSA trending up and PET scan showed local recurrence.   Following up with Dr. Nieves for treatment.  -Continue to follow with Dr. Nieves    Orders Placed This Encounter  Procedures   CBC with Differential    Standing Status:   Future    Expected Date:   12/09/2024    Expiration Date:   03/09/2025   Comprehensive metabolic panel    Standing Status:   Future    Expected Date:   12/09/2024    Expiration Date:   03/09/2025   Lactate dehydrogenase    Standing Status:   Future    Expected Date:   12/09/2024    Expiration Date:   03/09/2025    The total time spent in  the appointment was 30 minutes encounter with patients including review of chart and various tests results, discussions about plan of care and coordination of care plan   All questions were answered. The patient knows to call the clinic with any problems, questions or concerns. No barriers to learning was detected.   LILLETTE Verneta SAUNDERS Teague,acting as a Neurosurgeon for Mickiel Dry, MD.,have documented all relevant documentation on the behalf of Mickiel Dry, MD,as directed by  Mickiel Dry, MD while in the presence of Mickiel Dry, MD.  Mickiel Dry, MD Hematology/Oncology [facility] at [facility]   Mickiel Dry, [place] 9/18/20253:45 PM    Oncology History  CLL (chronic lymphocytic leukemia) [facility])  11/08/2023 Cancer Staging   Staging form: Chronic Lymphocytic Leukemia / Small Lymphocytic Lymphoma, AJCC 8th Edition - Clinical stage from 11/08/2023: Modified Rai Stage I (Modified Rai risk: Intermediate, Binet: Stage B, Lymphocytosis: Present, Adenopathy: Present, Organomegaly: Absent, Anemia: Absent, Thrombocytopenia: Absent) - Signed by Dry Mickiel, MD on 02/21/2024 Stage prefix: Initial diagnosis   11/08/2023 Pathology Results   Flow cytometry:  Consistent with CLL. Phenotype of Abnormal Cells: CD5, CD19, CD20, CD200, Kappa   FISH:  Positive for loss of one ATM signal and a loss of one 13q14 signal.  CCND1/IGH, chromosome 12, and TP53 were normal.    01/29/2024 Imaging   PSMA PET scan:  1. Progressive local recurrence of prostate carcinoma, as evidenced by increased  right and developing left seminal vesicle tracer affinity. New or increased base to midgland diffuse tracer affinity. 2. Significant progression of adenopathy throughout the neck, chest, abdomen, and pelvis. Given distribution and lack of significant tracer affinity, strongly favored to represent lymphoma. Biggest LN is 3 cm. No bulky lymphadenopathy.    02/21/2024 Initial Diagnosis   CLL  (chronic lymphocytic leukemia) (HCC)   06/26/2024 Imaging   CT CAP and neck:  Adenopathy above and below the diaphragm consistent with patient's known CLL. No splenomegaly. Stable bilateral lower lung predominant solid pulmonary nodules measuring up to 8 mm not radiotracer avid on prior PSMA PET-CT and favored benign.   Widespread bilateral lymphadenopathy throughout the neck at all nodal stations consistent with the clinical diagnosis of CLL.      INTERVAL HISTORY: Hyrum Shaneyfelt 78 y.o. male following for CLL.  He is accompanied by by a friend who he lives with.   No symptoms such as night sweats, fatigue, chills, or fevers.  He has been experiencing shoulder pain for the last two to three months, which he suspects might be due to arthritis. This issue was mentioned to his primary care physician.  His appetite is reportedly good, and he denies any nausea or bowel problems. He is able to walk well and maintains good energy levels.  I have reviewed the past medical history, past surgical history, social history and family history with the patient   ALLERGIES:  has no known allergies.  MEDICATIONS:  Current Outpatient Medications  Medication Sig Dispense Refill   alendronate (FOSAMAX) 70 MG tablet Take 70 mg by mouth once a week.     aspirin  EC 81 MG tablet Take 1 tablet (81 mg total) by mouth daily with breakfast. Swallow whole. 30 tablet 11   calcium acetate (PHOSLO) 667 MG tablet Take 667 mg by mouth 2 (two) times daily.     cetirizine (ZYRTEC) 10 MG tablet Take 10 mg by mouth daily.     Cholecalciferol (VITAMIN D-3) 25 MCG (1000 UT) CAPS Take 1 capsule by mouth daily.     finasteride  (PROSCAR ) 5 MG tablet Take 1 tablet (5 mg total) by mouth daily. 90 tablet 3   gabapentin  (NEURONTIN ) 300 MG capsule Take 1 capsule by mouth 3 (three) times daily.     losartan (COZAAR) 50 MG tablet Take 1 tablet by mouth daily.     omeprazole (PRILOSEC) 40 MG capsule Take 1 capsule by mouth daily.      oxyCODONE -acetaminophen  (PERCOCET) 10-325 MG tablet Take 1 tablet by mouth 4 (four) times daily as needed for pain.     polyethylene glycol (MIRALAX  / GLYCOLAX ) 17 g packet Take 17 g by mouth daily. 30 each 4   simvastatin (ZOCOR) 40 MG tablet Take 40 mg by mouth at bedtime.     tamsulosin  (FLOMAX ) 0.4 MG CAPS capsule Take 1 capsule (0.4 mg total) by mouth daily. 90 capsule 3   tiZANidine (ZANAFLEX) 4 MG tablet Take 4 mg by mouth 2 (two) times daily.     No current facility-administered medications for this visit.     REVIEW OF SYSTEMS:   Constitutional: Denies fevers, chills or night sweats Eyes: Denies blurriness of vision Ears, nose, mouth, throat, and face: Denies mucositis or sore throat Respiratory: Denies cough, dyspnea or wheezes Cardiovascular: Denies palpitation, chest discomfort or lower extremity swelling Gastrointestinal:  Denies nausea, heartburn or change in bowel habits Skin: Denies abnormal skin rashes Lymphatics: Denies new lymphadenopathy or easy bruising Neurological:Denies numbness, tingling or  new weaknesses Behavioral/Psych: Mood is stable, no new changes  All other systems were reviewed with the patient and are negative.  PHYSICAL EXAMINATION:   Vitals:   09/12/24 1506  BP: 126/79  Pulse: 89  Resp: 18  Temp: 97.9 F (36.6 C)  SpO2: 95%     GENERAL:alert, no distress and comfortable SKIN: skin color, texture, turgor are normal, no rashes or significant lesions LYMPH: Palpable 5 cm into 4 cm cervical lymphadenopathy in the left level 3 and 4, no palpable lymphadenopathy in the right cervical, axillary or inguinal LUNGS: clear to auscultation and percussion with normal breathing effort HEART: regular rate & rhythm and no murmurs and no lower extremity edema ABDOMEN:abdomen soft, non-tender and normal bowel sounds Musculoskeletal:no cyanosis of digits and no clubbing  NEURO: alert & oriented x 3 with fluent speech  LABORATORY DATA:  I have reviewed  the data as listed  Lab Results  Component Value Date   WBC 45.8 (H) 09/05/2024   NEUTROABS 3.9 09/05/2024   HGB 13.5 09/05/2024   HCT 42.7 09/05/2024   MCV 87.1 09/05/2024   PLT 179 09/05/2024      Component Value Date/Time   NA 140 09/05/2024 1534   K 4.2 09/05/2024 1534   CL 104 09/05/2024 1534   CO2 22 09/05/2024 1534   GLUCOSE 128 (H) 09/05/2024 1534   BUN 14 09/05/2024 1534   CREATININE 1.11 09/05/2024 1534   CALCIUM 8.9 09/05/2024 1534   PROT 7.1 09/05/2024 1534   ALBUMIN 4.1 09/05/2024 1534   AST 33 09/05/2024 1534   ALT 20 09/05/2024 1534   ALKPHOS 59 09/05/2024 1534   BILITOT 1.1 09/05/2024 1534   GFRNONAA >60 09/05/2024 1534       Chemistry      Component Value Date/Time   NA 140 09/05/2024 1534   K 4.2 09/05/2024 1534   CL 104 09/05/2024 1534   CO2 22 09/05/2024 1534   BUN 14 09/05/2024 1534   CREATININE 1.11 09/05/2024 1534      Component Value Date/Time   CALCIUM 8.9 09/05/2024 1534   ALKPHOS 59 09/05/2024 1534   AST 33 09/05/2024 1534   ALT 20 09/05/2024 1534   BILITOT 1.1 09/05/2024 1534      Latest Reference Range & Units 09/05/24 15:34  LDH 98 - 192 U/L 141     RADIOGRAPHIC STUDIES: I have personally reviewed the radiological images as listed and agreed with the findings in the report.  CT SOFT TISSUE NECK W CONTRAST CLINICAL DATA:  Hematologic malignancy. Staging. Chronic lymphocytic leukemia.  EXAM: CT NECK WITH CONTRAST  TECHNIQUE: Multidetector CT imaging of the neck was performed using the standard protocol following the bolus administration of intravenous contrast.  RADIATION DOSE REDUCTION: This exam was performed according to the departmental dose-optimization program which includes automated exposure control, adjustment of the mA and/or kV according to patient size and/or use of iterative reconstruction technique.  CONTRAST:  OMNIPAQUE  IOHEXOL  300 MG/ML  SOLN  COMPARISON:  None  Available.  FINDINGS: Pharynx and larynx: No mucosal or submucosal lesion.  Salivary glands: Parotid and submandibular glands are intrinsically normal. There are enlarged intraparotid lymph nodes on both sides. Index node in the right parotid axial image 33 measures 7.3 mm.  Thyroid : Normal  Lymph nodes: Widespread bilateral lymphadenopathy throughout the neck consistent with the clinical diagnosis of CLL. Index nodes are as follows. Right submandibular node measuring 16 x 8 x 9 mm. Right level 2 lymph node image 58 with  some internal low density measuring 24 x 15 mm. Posterior cervical node on the right image 41 measuring 14 x 12 mm. On the left, posterior cervical node image 42 measuring 36 x 21 mm. Level 2 node image 50 measuring 21 x 16 mm.  Vascular: Minimal atherosclerosis of the carotid bifurcations.  Limited intracranial: Normal  Visualized orbits: Normal  Mastoids and visualized paranasal sinuses: Clear  Skeleton: Ordinary cervical spondylosis.  Upper chest: See results of chest CT.  Other: None  IMPRESSION: Widespread bilateral lymphadenopathy throughout the neck at all nodal stations consistent with the clinical diagnosis of CLL. Index nodes are as outlined above.  Electronically Signed   By: Oneil Officer M.D.   On: 06/30/2024 18:53    CLINICAL DATA:  Hematologic malignancy, CLL, staging * Tracking Code: BO * .   EXAM: CT CHEST, ABDOMEN, AND PELVIS WITH CONTRAST   TECHNIQUE: Multidetector CT imaging of the chest, abdomen and pelvis was performed following the standard protocol during bolus administration of intravenous contrast.   RADIATION DOSE REDUCTION: This exam was performed according to the departmental dose-optimization program which includes automated exposure control, adjustment of the mA and/or kV according to patient size and/or use of iterative reconstruction technique.   CONTRAST:  OMNIPAQUE  IOHEXOL  300 MG/ML  SOLN    COMPARISON:  PET-CT January 29, 2024.   FINDINGS: CT CHEST FINDINGS   Cardiovascular: Aortic atherosclerosis. Coronary artery calcifications. No significant pericardial effusion/thickening.   Mediastinum/Nodes: No suspicious thyroid  nodule.   Bilateral low cervical, supraclavicular, mediastinal, hilar and axillary adenopathy. For reference:   -right precarinal lymph node measures 2.7 x 1.8 cm on image 23/2.   -right axillary lymph node measures 2.7 x 2.0 cm on image 32/2.   Small hiatal hernia   Lungs/Pleura: Motion degraded examination of the lungs. Stable bilateral lower lung predominant solid pulmonary nodules. For reference:   -solid right middle lobe pulmonary nodule measures 8 mm on image 80/3, unchanged.   -4 mm left lower lobe pulmonary nodule on image 86/3, unchanged.   Musculoskeletal: No aggressive lytic or blastic lesion of bone. Multilevel degenerative change of the spine. Degenerative changes bilateral shoulders. Diffuse demineralization of bone. Remote left rib fractures.   CT ABDOMEN PELVIS FINDINGS   Hepatobiliary: No suspicious hepatic lesion. Cholelithiasis. No biliary ductal dilation.   Pancreas: No pancreatic ductal dilation or evidence of acute inflammation.   Spleen: No splenomegaly.   Adrenals/Urinary Tract: No suspicious adrenal nodule/mass. Left upper pole renal cyst. Kidneys demonstrate symmetric enhancement. Urinary bladder is unremarkable for degree of distension.   Stomach/Bowel: Stomach is unremarkable for degree of distension. Non applied appendix. Colonic diverticulosis.   Vascular/Lymphatic: Aortic atherosclerosis.   Right upper quadrant, retroperitoneal mesenteric, and bilateral iliac side chain adenopathy. For reference:   -portacaval lymph node measures 5.4 x 3.7 cm on image 62/2.   -left external iliac lymph node measures 5.6 x 3.8 cm on image 111/2.   Reproductive: Brachytherapy seeds in the prostate gland.    Other: No significant abdominopelvic free fluid.   Musculoskeletal: No aggressive lytic or blastic lesion of bone.   IMPRESSION: 1. Adenopathy above and below the diaphragm consistent with patient's known CLL. 2. No splenomegaly. 3. Stable bilateral lower lung predominant solid pulmonary nodules measuring up to 8 mm not radiotracer avid on prior PSMA PET-CT and favored benign. Suggest attention on follow-up imaging. 4. Cholelithiasis. 5. Colonic diverticulosis. 6.  Aortic Atherosclerosis (ICD10-I70.0).     Electronically Signed   By: Reyes Raymondo HERO.D.  On: 06/26/2024 15:50

## 2024-09-12 NOTE — Assessment & Plan Note (Addendum)
 Patient has a history of follow-up prostate cancer s/p XRT and ADT in 2015.  Recent PSA trending up and PET scan showed local recurrence.  Following up with Dr. Mena Goes for treatment. -Continue to follow with Dr. Mena Goes

## 2024-09-12 NOTE — Assessment & Plan Note (Addendum)
 Presence of leukocytosis with no anemia or thrombocytopenia.   Diffuse adenopathy present on PSMA PET scan but with low uptake/SUV.   No splenomegaly.  Patient has no B symptoms at this time.  RAI stage I (lymphadenopathy) Patient currently has palpable lymphadenopathy in his left cervical lymph node Lymphocytes trending up with currently less than 50% increase over the past 3 months  -We reviewed the CT scans together.  Patient has diffuse lymphadenopathy but nothing more than 8 cm.  No splenomegaly.  No hepatomegaly. - Labs reviewed today: CMP: WNL, LDH: Normal, CBC: WBC: 45.8, ALC: 40.9, hemoglobin: 13.5, platelets: 179 -Patient does not meet criteria for treatment at this time. - Patient denies B symptoms today - If patient develops B symptoms or if there is significant increase in lymphocytes or is thrombocytopenic or anemic, will consider starting treatment at that time  Return to clinic in 3 months with labs.

## 2024-09-12 NOTE — Patient Instructions (Signed)

## 2024-10-21 ENCOUNTER — Other Ambulatory Visit: Payer: Medicare (Managed Care)

## 2024-10-21 DIAGNOSIS — R9721 Rising PSA following treatment for malignant neoplasm of prostate: Secondary | ICD-10-CM

## 2024-10-22 ENCOUNTER — Ambulatory Visit: Payer: Self-pay

## 2024-10-22 LAB — PSA: Prostate Specific Ag, Serum: 2.1 ng/mL (ref 0.0–4.0)

## 2024-11-11 ENCOUNTER — Ambulatory Visit: Payer: Medicare (Managed Care) | Admitting: Urology

## 2024-11-11 VITALS — BP 123/76 | HR 102

## 2024-11-11 DIAGNOSIS — R9721 Rising PSA following treatment for malignant neoplasm of prostate: Secondary | ICD-10-CM | POA: Diagnosis not present

## 2024-11-11 DIAGNOSIS — N138 Other obstructive and reflux uropathy: Secondary | ICD-10-CM

## 2024-11-11 DIAGNOSIS — N401 Enlarged prostate with lower urinary tract symptoms: Secondary | ICD-10-CM

## 2024-11-11 DIAGNOSIS — R3912 Poor urinary stream: Secondary | ICD-10-CM

## 2024-11-11 LAB — URINALYSIS, ROUTINE W REFLEX MICROSCOPIC
Bilirubin, UA: NEGATIVE
Glucose, UA: NEGATIVE
Ketones, UA: NEGATIVE
Leukocytes,UA: NEGATIVE
Nitrite, UA: NEGATIVE
Protein,UA: NEGATIVE
RBC, UA: NEGATIVE
Specific Gravity, UA: <1.005 — ABNORMAL LOW (ref 1.005–1.030)
Urobilinogen, Ur: 0.2 mg/dL (ref 0.2–1.0)
pH, UA: 7 (ref 5.0–7.5)

## 2024-11-11 MED ORDER — FINASTERIDE 5 MG PO TABS: 5.0000 mg | ORAL_TABLET | Freq: Every day | ORAL | 3 refills | Status: AC

## 2024-11-11 NOTE — Progress Notes (Unsigned)
 11/11/2024 2:39 PM   Mason Aguilar 01-06-1946 [id number]  Referring provider: Austin Mutton, MD [address] [place],  [place] [address]  No chief complaint on file.   HPI:  F/u -     1) rising PSA p PCa treatment - he was tx with ADT and XRT in 2015 for a unfavorable intermediate risk prostate cancer diagnosed in October 2015 with pretreatment PSA of 5.8 and reported SV invasion on MRI with negative bone scan and prostate volume of 60 g. His PSA rose to 1.01 in 2021. His 2022 PSA was 1.6. Started 5ari for hematuria Nov 2022. His 2023 PSA stable at 1.6, but with left and right external iliac LAD.His May 2024 PSA stable at 1.6. September 2024 PSA 2.8 (5.6). Current PSADT ~ 9 months. February 2025 PET scan-prostate only activity with prostate activity and left greater than right SV activity.  No metastatic disease.  Increased adenopathy of the neck, chest, abdomen and pelvis with lack of tracer suggest CLL.  Patient with a history of CLL but no B symptoms on surveillance. Saw Dr. Davonna and has CLL under surveillance. No treatment needed at this time. Apr 2025 PSA 2.8 (5.6) -  Stable. May 2025 Cr 1.6.    Primary treatment: 2015 ADT and EBXRT    Current Tx: NONE    Staging:  05/22 CT non-con - no LAD or bone lesions 07/22 MRI abd - benign  10/22 CT non-con - new 12 mm right ext iliac lymph node, no bone lesions  11/22 PSMA PET scan - focal right SV activity, no metastatic disease - I reviewed the images  10/23 CT a/p with - left and right external iliac LAD slight increase since 10/22 scan. No bone.   Jul 2025 CT C/A/P - inc LAD from CLL. No bone lesions.    2)  gross hematuria- patient with gross hematuria with clots December 2021. Hematuria episode 10/22. Cystoscopy 10/22 benign. Friable prostate and bladder neck vessels. He is on finasteride  and tamsulosin . Imaging - as above.  Cysto benign again Nov 2023. Continues finasteride . No gross hematuria.   Today, seen for the above.   Jul 2025 CT C/A/P - inc LAD from CLL. No bone lesions.  Oct 2025 PSA 2.1 (4.2). cr 1.11. No gross hematuria.    He is here with his niece Mason Aguilar. Mason Aguilar lives with her. Call her with results - [phone].      PMH: Past Medical History:  Diagnosis Date   HOH (hard of hearing)    Hypercholesteremia    Hypertension    Prostate cancer [facility])     Surgical History: Past Surgical History:  Procedure Laterality Date   arm surgery     OPEN REDUCTION INTERNAL FIXATION (ORIF) METACARPAL Right 06/03/2021   Procedure: OPEN REDUCTION INTERNAL FIXATION (ORIF)RIGHT FIFTH  METACARPAL;  Surgeon: Murrell Kuba, MD;  Location: [facility];  Service: Orthopedics;  Laterality: Right;  AXILLARY BLOCK   polyps      Home Medications:  Allergies as of 11/11/2024   No Known Allergies      Medication List        Accurate as of November 11, 2024  2:39 PM. If you have any questions, ask your nurse or doctor.          alendronate 70 MG tablet Commonly known as: FOSAMAX Take 70 mg by mouth once a week.   aspirin  EC 81 MG tablet Take 1 tablet (81 mg total) by mouth daily with breakfast.  Swallow whole.   calcium acetate 667 MG tablet Commonly known as: PHOSLO Take 667 mg by mouth 2 (two) times daily.   cetirizine 10 MG tablet Commonly known as: ZYRTEC Take 10 mg by mouth daily.   finasteride  5 MG tablet Commonly known as: PROSCAR  Take 1 tablet (5 mg total) by mouth daily.   gabapentin  300 MG capsule Commonly known as: NEURONTIN  Take 1 capsule by mouth 3 (three) times daily.   losartan 50 MG tablet Commonly known as: COZAAR Take 1 tablet by mouth daily.   omeprazole 40 MG capsule Commonly known as: PRILOSEC Take 1 capsule by mouth daily.   oxyCODONE -acetaminophen  10-325 MG tablet Commonly known as: PERCOCET Take 1 tablet by mouth 4 (four) times daily as needed for pain.   polyethylene glycol 17 g packet Commonly known as: MIRALAX  / GLYCOLAX  Take 17 g by  mouth daily.   simvastatin 40 MG tablet Commonly known as: ZOCOR Take 40 mg by mouth at bedtime.   tamsulosin  0.4 MG Caps capsule Commonly known as: FLOMAX  Take 1 capsule (0.4 mg total) by mouth daily.   tiZANidine 4 MG tablet Commonly known as: ZANAFLEX Take 4 mg by mouth 2 (two) times daily.   Vitamin D-3 25 MCG (1000 UT) Caps Take 1 capsule by mouth daily.        Allergies: No Known Allergies  Family History: No family history on file.  Social History:  reports that he has never smoked. He has never used smokeless tobacco. He reports that he does not drink alcohol and does not use drugs.   Physical Exam: There were no vitals taken for this visit.  Constitutional:  Alert and oriented, No acute distress. HEENT: [place] AT, moist mucus membranes.  Trachea midline, no masses. Cardiovascular: No clubbing, cyanosis, or edema. Respiratory: Normal respiratory effort, no increased work of breathing. GI: Abdomen is soft, nontender, nondistended, no abdominal masses GU: No CVA tenderness Skin: No rashes, bruises or suspicious lesions. Neurologic: Grossly intact, no focal deficits, moving all 4 extremities. Psychiatric: Normal mood and affect.  Laboratory Data: Lab Results  Component Value Date   WBC 45.8 (H) 09/05/2024   HGB 13.5 09/05/2024   HCT 42.7 09/05/2024   MCV 87.1 09/05/2024   PLT 179 09/05/2024    Lab Results  Component Value Date   CREATININE 1.11 09/05/2024    No results found for: PSA  Lab Results  Component Value Date   TESTOSTERONE  425 10/10/2022    No results found for: HGBA1C  Urinalysis    Component Value Date/Time   COLORURINE RED (A) 09/27/2022 1132   APPEARANCEUR Clear 07/01/2024 1454   LABSPEC  09/27/2022 1132    TEST NOT REPORTED DUE TO COLOR INTERFERENCE OF URINE PIGMENT   PHURINE  09/27/2022 1132    TEST NOT REPORTED DUE TO COLOR INTERFERENCE OF URINE PIGMENT   GLUCOSEU Negative 07/01/2024 1454   HGBUR (A) 09/27/2022 1132     TEST NOT REPORTED DUE TO COLOR INTERFERENCE OF URINE PIGMENT   BILIRUBINUR Negative 07/01/2024 1454   KETONESUR (A) 09/27/2022 1132    TEST NOT REPORTED DUE TO COLOR INTERFERENCE OF URINE PIGMENT   PROTEINUR Negative 07/01/2024 1454   PROTEINUR (A) 09/27/2022 1132    TEST NOT REPORTED DUE TO COLOR INTERFERENCE OF URINE PIGMENT   UROBILINOGEN negative (A) 10/31/2022 1425   NITRITE Negative 07/01/2024 1454   NITRITE (A) 09/27/2022 1132    TEST NOT REPORTED DUE TO COLOR INTERFERENCE OF URINE PIGMENT   LEUKOCYTESUR Negative  07/01/2024 1454   LEUKOCYTESUR (A) 09/27/2022 1132    TEST NOT REPORTED DUE TO COLOR INTERFERENCE OF URINE PIGMENT    Lab Results  Component Value Date   LABMICR Comment 07/01/2024   WBCUA 0-5 10/10/2022   LABEPIT 0-10 10/10/2022   MUCUS Present 09/27/2021   BACTERIA Few (A) 10/10/2022    Pertinent Imaging: 2025 ct a/p images   Results for orders placed during the hospital encounter of 09/26/21  CT Renal Stone Study  Narrative CLINICAL DATA:  Hematuria. History of prostate cancer. Right flank pain.  EXAM: CT ABDOMEN AND PELVIS WITHOUT CONTRAST  TECHNIQUE: Multidetector CT imaging of the abdomen and pelvis was performed following the standard protocol without IV contrast.  COMPARISON:  MRI abdomen 07/07/2021. CT chest abdomen and pelvis 05/09/2021.  FINDINGS: Lower chest: There is a stable 3 mm nodular density in the left lower lobe image 4/6. Visualized lung bases are otherwise clear.  Hepatobiliary: No focal liver abnormality is seen. Small gallstones are present. There is no biliary ductal dilatation.  Pancreas: Unremarkable. No pancreatic ductal dilatation or surrounding inflammatory changes.  Spleen: Normal in size without focal abnormality.  Adrenals/Urinary Tract: There is no evidence for urinary tract calculus or hydronephrosis. There is a 6.1 cm left renal cyst which is similar to the prior study. Bilateral adrenal glands are  within normal limits. There is heterogeneous hyperdensity within the posterior bladder measured 2.1 x 2.3 by 3.0 cm. This has ill-defined borders. The bladder is otherwise within normal limits.  Stomach/Bowel: There is a small hiatal hernia. Stomach is otherwise within normal limits. Appendix appears normal. No evidence of bowel wall thickening, distention, or inflammatory changes. There is sigmoid colon diverticulosis without evidence for acute diverticulitis.  Vascular/Lymphatic: Aortic atherosclerosis. No evidence for aneurysm. There are mildly enlarged right external iliac lymph nodes measuring up to 12 mm, unchanged from prior.  Reproductive: Prostate radiotherapy seeds are again noted.  Other: There are small fat containing bilateral inguinal hernias. There is no free fluid or free air.  Musculoskeletal: Degenerative changes affect the spine and hips. There is stable mild compression deformity of T11.  IMPRESSION: 1. Heterogeneous hyperdensity in the posterior bladder worrisome for hemorrhage. Cannot exclude underlying bladder lesion. 2. No hydronephrosis or urinary tract calculus. 3. Colonic diverticulosis without evidence for diverticulitis. 4. Stable mildly enlarged external iliac chain lymph nodes. 5.  Aortic Atherosclerosis (ICD10-I70.0).   Electronically Signed By: Greig Pique M.D. On: 09/26/2021 23:28   Assessment & Plan:    Rising PSA p prostate cancer treatment - PSA remains stable.   BPH with LUTS - continue finasteride    No follow-ups on file.  Donnice Brooks, MD  [facility]  [address] [place], [place] [address] [phone]

## 2024-12-09 ENCOUNTER — Inpatient Hospital Stay: Payer: Medicare (Managed Care) | Attending: Oncology

## 2024-12-09 DIAGNOSIS — Z8546 Personal history of malignant neoplasm of prostate: Secondary | ICD-10-CM | POA: Diagnosis not present

## 2024-12-09 DIAGNOSIS — C9110 Chronic lymphocytic leukemia of B-cell type not having achieved remission: Secondary | ICD-10-CM

## 2024-12-09 DIAGNOSIS — Z9229 Personal history of other drug therapy: Secondary | ICD-10-CM | POA: Diagnosis not present

## 2024-12-09 DIAGNOSIS — Z923 Personal history of irradiation: Secondary | ICD-10-CM | POA: Insufficient documentation

## 2024-12-09 LAB — CBC WITH DIFFERENTIAL/PLATELET
Basophils Absolute: 0 K/uL (ref 0.0–0.1)
Basophils Relative: 0 %
Eosinophils Absolute: 0 K/uL (ref 0.0–0.5)
Eosinophils Relative: 0 %
HCT: 42.4 % (ref 39.0–52.0)
Hemoglobin: 13.1 g/dL (ref 13.0–17.0)
Lymphocytes Relative: 93 %
Lymphs Abs: 49.3 K/uL — ABNORMAL HIGH (ref 0.7–4.0)
MCH: 26.9 pg (ref 26.0–34.0)
MCHC: 30.9 g/dL (ref 30.0–36.0)
MCV: 87.1 fL (ref 80.0–100.0)
Monocytes Absolute: 1.1 K/uL — ABNORMAL HIGH (ref 0.1–1.0)
Monocytes Relative: 2 %
Neutro Abs: 2.7 K/uL (ref 1.7–7.7)
Neutrophils Relative %: 5 %
Platelets: 160 K/uL (ref 150–400)
RBC: 4.87 MIL/uL (ref 4.22–5.81)
RDW: 14 % (ref 11.5–15.5)
Smear Review: NORMAL
WBC: 53 K/uL (ref 4.0–10.5)
nRBC: 0 % (ref 0.0–0.2)

## 2024-12-09 LAB — COMPREHENSIVE METABOLIC PANEL WITH GFR
ALT: 9 U/L (ref 0–44)
AST: 21 U/L (ref 15–41)
Albumin: 4.6 g/dL (ref 3.5–5.0)
Alkaline Phosphatase: 66 U/L (ref 38–126)
Anion gap: 13 (ref 5–15)
BUN: 11 mg/dL (ref 8–23)
CO2: 22 mmol/L (ref 22–32)
Calcium: 9.3 mg/dL (ref 8.9–10.3)
Chloride: 104 mmol/L (ref 98–111)
Creatinine, Ser: 0.94 mg/dL (ref 0.61–1.24)
GFR, Estimated: >60 mL/min (ref >60)
Glucose, Bld: 146 mg/dL — ABNORMAL HIGH (ref 70–99)
Potassium: 4.2 mmol/L (ref 3.5–5.1)
Sodium: 139 mmol/L (ref 135–145)
Total Bilirubin: 0.5 mg/dL (ref 0.0–1.2)
Total Protein: 6.7 g/dL (ref 6.5–8.1)

## 2024-12-09 LAB — LACTATE DEHYDROGENASE: LDH: 170 U/L (ref 105–235)

## 2024-12-09 NOTE — Progress Notes (Signed)
 CRITICAL VALUE ALERT Critical value received:  WBC 53.0 Date of notification:  12-09-24 Time of notification: 1632 Critical value read back:  Yes.   Nurse who received alert:  C. Briya Lookabaugh RN MD notified time and response:  Dr. Davonna no new orders.

## 2024-12-16 ENCOUNTER — Inpatient Hospital Stay: Payer: Medicare (Managed Care) | Admitting: Physician Assistant

## 2024-12-16 VITALS — BP 131/76 | HR 82 | Temp 99.1°F | Resp 16 | Wt 239.0 lb

## 2024-12-16 DIAGNOSIS — C9110 Chronic lymphocytic leukemia of B-cell type not having achieved remission: Secondary | ICD-10-CM

## 2024-12-16 NOTE — Progress Notes (Signed)
 " [facility]   PROGRESS NOTE  Patient Care Team: Austin Mutton, MD as PCP - General (Internal Medicine)   CHIEF COMPLAINTS/PURPOSE OF CONSULTATION:  CLL  Oncology History  CLL (chronic lymphocytic leukemia) (HCC)  11/08/2023 Cancer Staging   Staging form: Chronic Lymphocytic Leukemia / Small Lymphocytic Lymphoma, AJCC 8th Edition - Clinical stage from 11/08/2023: Modified Rai Stage I (Modified Rai risk: Intermediate, Binet: Stage B, Lymphocytosis: Present, Adenopathy: Present, Organomegaly: Absent, Anemia: Absent, Thrombocytopenia: Absent) - Signed by Davonna Siad, MD on 02/21/2024 Stage prefix: Initial diagnosis   11/08/2023 Pathology Results   Flow cytometry:  Consistent with CLL. Phenotype of Abnormal Cells: CD5, CD19, CD20, CD200, Kappa   FISH:  Positive for loss of one ATM signal and a loss of one 13q14 signal.  CCND1/IGH, chromosome 12, and TP53 were normal.    01/29/2024 Imaging   PSMA PET scan:  1. Progressive local recurrence of prostate carcinoma, as evidenced by increased right and developing left seminal vesicle tracer affinity. New or increased base to midgland diffuse tracer affinity. 2. Significant progression of adenopathy throughout the neck, chest, abdomen, and pelvis. Given distribution and lack of significant tracer affinity, strongly favored to represent lymphoma. Biggest LN is 3 cm. No bulky lymphadenopathy.    02/21/2024 Initial Diagnosis   CLL (chronic lymphocytic leukemia) (HCC)   06/26/2024 Imaging   CT CAP and neck:  Adenopathy above and below the diaphragm consistent with patient's known CLL. No splenomegaly. Stable bilateral lower lung predominant solid pulmonary nodules measuring up to 8 mm not radiotracer avid on prior PSMA PET-CT and favored benign.   Widespread bilateral lymphadenopathy throughout the neck at all nodal stations consistent with the clinical diagnosis of CLL.    CURRENT TREATMENT: Observation  INTERVAL HISTORY:   Mason Aguilar 78 y.o. male returns for follow-up for CLL.  He was last seen by Dr. Davonna on 09/12/2024.  In the interim, he denies any changes to his health.  On exam today, Mason Aguilar reports his energy and appetite are unchanged since last visit.  He is able to do his baseline ADLs on his own.  He denies nausea, vomiting or abdominal pain.  He has baseline neuropathy in his fingers.  He denies easy bruising or overt signs of bleeding.  He reports the palpable lymph node in the left posterior cervical region is unchanged from the last visit.  He denies any new palpable lymph nodes.  He denies fevers, chills, night sweats, shortness of breath, chest pain or cough.  Rest of the 10 point ROS as below.  MEDICAL HISTORY:  Past Medical History:  Diagnosis Date   HOH (hard of hearing)    Hypercholesteremia    Hypertension    Prostate cancer (HCC)     SURGICAL HISTORY: Past Surgical History:  Procedure Laterality Date   arm surgery     OPEN REDUCTION INTERNAL FIXATION (ORIF) METACARPAL Right 06/03/2021   Procedure: OPEN REDUCTION INTERNAL FIXATION (ORIF)RIGHT FIFTH  METACARPAL;  Surgeon: Murrell Kuba, MD;  Location: [facility];  Service: Orthopedics;  Laterality: Right;  AXILLARY BLOCK   polyps      SOCIAL HISTORY: Social History   Socioeconomic History   Marital status: Widowed    Spouse name: Not on file   Number of children: Not on file   Years of education: Not on file   Highest education level: Not on file  Occupational History   Not on file  Tobacco Use   Smoking status: Never  Smokeless tobacco: Never  Vaping Use   Vaping status: Never Used  Substance and Sexual Activity   Alcohol use: No   Drug use: No   Sexual activity: Not on file  Other Topics Concern   Not on file  Social History Narrative   Not on file   Social Drivers of Health   Tobacco Use: Low Risk (07/01/2024)   Patient History    Smoking Tobacco Use: Never    Smokeless Tobacco Use: Never     Passive Exposure: Not on file  Financial Resource Strain: Not on file  Food Insecurity: No Food Insecurity (09/28/2022)   Hunger Vital Sign    Worried About Running Out of Food in the Last Year: Never true    Ran Out of Food in the Last Year: Never true  Transportation Needs: No Transportation Needs (09/28/2022)   PRAPARE - Administrator, Civil Service (Medical): No    Lack of Transportation (Non-Medical): No  Physical Activity: Not on file  Stress: Not on file  Social Connections: Not on file  Intimate Partner Violence: Not At Risk (09/28/2022)   Humiliation, Afraid, Rape, and Kick questionnaire    Fear of Current or Ex-Partner: No    Emotionally Abused: No    Physically Abused: No    Sexually Abused: No  Depression (PHQ2-9): Low Risk (12/16/2024)   Depression (PHQ2-9)    PHQ-2 Score: 0  Alcohol Screen: Not on file  Housing: Low Risk (09/28/2022)   Housing    Last Housing Risk Score: 0  Utilities: Not At Risk (09/28/2022)   AHC Utilities    Threatened with loss of utilities: No  Health Literacy: Not on file    FAMILY HISTORY: No family history on file.  ALLERGIES:  has no known allergies.  MEDICATIONS:  Current Outpatient Medications  Medication Sig Dispense Refill   aspirin  EC 81 MG tablet Take 1 tablet (81 mg total) by mouth daily with breakfast. Swallow whole. 30 tablet 11   calcium acetate (PHOSLO) 667 MG tablet Take 667 mg by mouth 2 (two) times daily.     cetirizine (ZYRTEC) 10 MG tablet Take 10 mg by mouth daily.     Cholecalciferol (VITAMIN D-3) 25 MCG (1000 UT) CAPS Take 1 capsule by mouth daily.     finasteride  (PROSCAR ) 5 MG tablet Take 1 tablet (5 mg total) by mouth daily. 90 tablet 3   gabapentin  (NEURONTIN ) 300 MG capsule Take 1 capsule by mouth 3 (three) times daily.     losartan (COZAAR) 50 MG tablet Take 1 tablet by mouth daily.     omeprazole (PRILOSEC) 40 MG capsule Take 1 capsule by mouth daily.     oxyCODONE -acetaminophen  (PERCOCET)  10-325 MG tablet Take 1 tablet by mouth 4 (four) times daily as needed for pain.     polyethylene glycol (MIRALAX  / GLYCOLAX ) 17 g packet Take 17 g by mouth daily. 30 each 4   simvastatin (ZOCOR) 40 MG tablet Take 40 mg by mouth at bedtime.     tamsulosin  (FLOMAX ) 0.4 MG CAPS capsule Take 1 capsule (0.4 mg total) by mouth daily. 90 capsule 3   tiZANidine (ZANAFLEX) 4 MG tablet Take 4 mg by mouth 2 (two) times daily.     alendronate (FOSAMAX) 70 MG tablet Take 70 mg by mouth once a week. (Patient not taking: Reported on 12/16/2024)     No current facility-administered medications for this visit.    REVIEW OF SYSTEMS:   Constitutional: ( - )  fevers, ( - )  chills , ( - ) night sweats Eyes: ( - ) blurriness of vision, ( - ) double vision, ( - ) watery eyes Ears, nose, mouth, throat, and face: ( - ) mucositis, ( - ) sore throat Respiratory: ( - ) cough, ( - ) dyspnea, ( - ) wheezes Cardiovascular: ( - ) palpitation, ( - ) chest discomfort, ( - ) lower extremity swelling Gastrointestinal:  ( - ) nausea, ( - ) heartburn, ( - ) change in bowel habits Skin: ( - ) abnormal skin rashes Lymphatics: ( - ) new lymphadenopathy, ( - ) easy bruising Neurological: (+ ) numbness, ( - ) tingling, ( - ) new weaknesses Behavioral/Psych: ( - ) mood change, ( - ) new changes  All other systems were reviewed with the patient and are negative.  PHYSICAL EXAMINATION: ECOG PERFORMANCE STATUS: 1 - Symptomatic but completely ambulatory  Vitals:   12/16/24 1502  BP: 131/76  Pulse: 82  Resp: 16  Temp: 99.1 F (37.3 C)  SpO2: 97%   Filed Weights   12/16/24 1502  Weight: 238 lb 15.7 oz (108.4 kg)    GENERAL: well appearing male in NAD  SKIN: skin color, texture, turgor are normal, no rashes or significant lesions EYES: conjunctiva are pink and non-injected, sclera clear OROPHARYNX: no exudate, no erythema; lips, buccal mucosa, and tongue normal  NECK: supple, non-tender LYMPH:  no palpable  lymphadenopathy in the axillary or supraclavicular lymph nodes. + Palpable lymph node in the left posterior cervical region that is nontender and without any surrounding erythema. LUNGS: clear to auscultation and percussion with normal breathing effort HEART: regular rate & rhythm and no murmurs and no lower extremity edema ABDOMEN: soft, non-tender, non-distended, normal bowel sounds Musculoskeletal: no cyanosis of digits and no clubbing  PSYCH: alert & oriented x 3, fluent speech NEURO: no focal motor/sensory deficits  LABORATORY DATA:  I have reviewed the data as listed    Latest Ref Rng & Units 12/09/2024    2:32 PM 09/05/2024    3:34 PM 05/15/2024    1:41 PM  CBC  WBC 4.0 - 10.5 K/uL 53.0  45.8  40.1   Hemoglobin 13.0 - 17.0 g/dL 86.8  86.4  86.3   Hematocrit 39.0 - 52.0 % 42.4  42.7  43.6   Platelets 150 - 400 K/uL 160  179  200        Latest Ref Rng & Units 12/09/2024    2:32 PM 09/05/2024    3:34 PM 05/15/2024    1:41 PM  CMP  Glucose 70 - 99 mg/dL 853  871  91   BUN 8 - 23 mg/dL 11  14  12    Creatinine 0.61 - 1.24 mg/dL 9.05  8.88  8.83   Sodium 135 - 145 mmol/L 139  140  135   Potassium 3.5 - 5.1 mmol/L 4.2  4.2  4.0   Chloride 98 - 111 mmol/L 104  104  105   CO2 22 - 32 mmol/L 22  22  23    Calcium 8.9 - 10.3 mg/dL 9.3  8.9  9.1   Total Protein 6.5 - 8.1 g/dL 6.7  7.1  7.1   Total Bilirubin 0.0 - 1.2 mg/dL 0.5  1.1  0.6   Alkaline Phos 38 - 126 U/L 66  59  69   AST 15 - 41 U/L 21  33  23   ALT 0 - 44 U/L 9  20  14  RADIOGRAPHIC STUDIES: I have personally reviewed the radiological images as listed and agreed with the findings in the report. No results found.  ASSESSMENT & PLAN Mason Aguilar is a 78 y.o. male who presents to the clinic for continued management of CLL.   #CLL-Rai Stage I: -- Evidence of diffuse lymphadenopathy seen on most recent CT imaging from July 2025. Lymphadenopathy is less than 10 cm and no evidence of splenomegaly.,  -- Labs from  12/09/2024 reviewed with the patient.  WBC 53.0, hemoglobin 13.1, platelets 160K, creatinine and LFTs in range. LDH normal.  --No worsening lymphadenopathy and no B-symptoms.  -- Patient does not meet criteria to initiate treatment so recommend to continue on surveillance. --RTC in 3 months with labs and follow up  #Prostate cancer: --S/P XRT and ADT in 2015 --Most recent PSA is 2.1 overall stablel.  --Dr. Nieves in urology is following patient closely.    No orders of the defined types were placed in this encounter.   All questions were answered. The patient knows to call the clinic with any problems, questions or concerns.  I have spent a total of 30 minutes minutes of face-to-face and non-face-to-face time, preparing to see the patient, performing a medically appropriate examination, counseling and educating the patient, documenting clinical information in the electronic health record, independently interpreting results and communicating results to the patient, and care coordination.   Johnston Police, PA-C Department of Hematology/Oncology [facility]  [place], [place] BD, [place] D, Caligaris-Cappio F, Dighiero G, Dhner H, Hillmen P, Keating M, Montserrat E, Chiorazzi N, Stilgenbauer S, Rai KR, Quinter, Eichhorst B, O'Brien S, Robak T, Seymour JF, Katherine UG. iwCLL guidelines for diagnosis, indications for treatment, response assessment, and supportive management of CLL. Blood. 2018 Jun 21;131(25):2745-2760.  Active disease should be clearly documented to initiate therapy. At least 1 of the following criteria should be met.  1) Evidence of progressive marrow failure as manifested by the development of, or worsening of, anemia and/or thrombocytopenia. Cutoff levels of Hb <10 g/dL or platelet counts <899  109/L are generally regarded as indication for treatment. However, in some patients, platelet counts <100  109/L may remain stable over a long  period; this situation does not automatically require therapeutic intervention. 2) Massive (ie, >=6 cm below the left costal margin) or progressive or symptomatic splenomegaly. 3) Massive nodes (ie, >=10 cm in longest diameter) or progressive or symptomatic lymphadenopathy. 4) Progressive lymphocytosis with an increase of >=50% over a 47-month period, or lymphocyte doubling time (LDT) <6 months. LDT can be obtained by linear regression extrapolation of absolute lymphocyte counts obtained at intervals of 2 weeks over an observation period of 2 to 3 months; patients with initial blood lymphocyte counts <30  109/L may require a longer observation period to determine the LDT. Factors contributing to lymphocytosis other than CLL (eg, infections, steroid administration) should be excluded. 5) Autoimmune complications including anemia or thrombocytopenia poorly responsive to corticosteroids. 6) Symptomatic or functional extranodal involvement (eg, skin, kidney, lung, spine). Disease-related symptoms as defined by any of the following: Unintentional weight loss >=10% within the previous 6 months. Significant fatigue (ie, ECOG performance scale 2 or worse; cannot work or unable to perform usual activities). Fevers >=100.21F or 38.0C for 2 or more weeks without evidence of infection. Night sweats for >=1 month without evidence of infection.  "

## 2024-12-17 ENCOUNTER — Other Ambulatory Visit: Payer: Self-pay | Admitting: *Deleted

## 2024-12-23 ENCOUNTER — Other Ambulatory Visit: Payer: Self-pay | Admitting: *Deleted

## 2025-03-17 ENCOUNTER — Inpatient Hospital Stay: Payer: Medicare (Managed Care)

## 2025-03-24 ENCOUNTER — Inpatient Hospital Stay: Payer: Medicare (Managed Care) | Admitting: Oncology

## 2025-05-06 ENCOUNTER — Other Ambulatory Visit: Payer: Medicare (Managed Care)

## 2025-05-12 ENCOUNTER — Ambulatory Visit: Payer: Medicare (Managed Care) | Admitting: Urology
